# Patient Record
Sex: Male | Born: 1990 | Race: Black or African American | Hispanic: No | Marital: Single | Smoking: Current every day smoker
Health system: Southern US, Community
[De-identification: ages and names within clinical notes are randomized; demographics above are authoritative.]

## PROBLEM LIST (undated history)

## (undated) DIAGNOSIS — L309 Dermatitis, unspecified: Secondary | ICD-10-CM

---
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
## 2015-01-22 ENCOUNTER — Encounter (HOSPITAL_COMMUNITY): Payer: Self-pay | Admitting: *Deleted

## 2015-01-22 ENCOUNTER — Emergency Department (HOSPITAL_COMMUNITY)
Admission: EM | Admit: 2015-01-22 | Discharge: 2015-01-23 | Disposition: A | Discharge status: Home / Self Care | Payer: 59 | Attending: Emergency Medicine | Admitting: Emergency Medicine

## 2015-01-22 DIAGNOSIS — M7981 Nontraumatic hematoma of soft tissue: Secondary | ICD-10-CM | POA: Diagnosis not present

## 2015-01-22 DIAGNOSIS — Z720 Tobacco use: Secondary | ICD-10-CM | POA: Diagnosis not present

## 2015-01-22 DIAGNOSIS — Z872 Personal history of diseases of the skin and subcutaneous tissue: Secondary | ICD-10-CM | POA: Insufficient documentation

## 2015-01-22 DIAGNOSIS — S301XXA Contusion of abdominal wall, initial encounter: Secondary | ICD-10-CM

## 2015-01-22 DIAGNOSIS — R319 Hematuria, unspecified: Secondary | ICD-10-CM | POA: Diagnosis not present

## 2015-01-22 DIAGNOSIS — R109 Unspecified abdominal pain: Secondary | ICD-10-CM | POA: Diagnosis not present

## 2015-01-22 DIAGNOSIS — R52 Pain, unspecified: Secondary | ICD-10-CM

## 2015-01-22 HISTORY — DX: Dermatitis, unspecified: L30.9

## 2015-01-22 LAB — URINALYSIS, ROUTINE W REFLEX MICROSCOPIC
BILIRUBIN URINE: NEGATIVE
Glucose, UA: NEGATIVE mg/dL
Hgb urine dipstick: NEGATIVE
KETONES UR: NEGATIVE mg/dL
Leukocytes, UA: NEGATIVE
Nitrite: NEGATIVE
PH: 5.5 (ref 5.0–8.0)
PROTEIN: 30 mg/dL — AB
SPECIFIC GRAVITY, URINE: 1.028 (ref 1.005–1.030)
UROBILINOGEN UA: 0.2 mg/dL (ref 0.0–1.0)

## 2015-01-22 LAB — URINE MICROSCOPIC-ADD ON

## 2015-01-22 NOTE — ED Notes (Signed)
Pt reports hematuria which started yesterday, went to [facility].  Was found to have blood and protein in his urine.  Has an appt with [facility] on 8/29.  Pt reports today, he started feeling a "pinch" in his RUQ.

## 2015-01-23 ENCOUNTER — Emergency Department (HOSPITAL_COMMUNITY): Payer: 59

## 2015-01-23 LAB — CBC WITH DIFFERENTIAL/PLATELET
BASOS PCT: 0 % (ref 0–1)
Basophils Absolute: 0 10*3/uL (ref 0.0–0.1)
Eosinophils Absolute: 0.2 10*3/uL (ref 0.0–0.7)
Eosinophils Relative: 3 % (ref 0–5)
HCT: 43.3 % (ref 39.0–52.0)
HEMOGLOBIN: 15 g/dL (ref 13.0–17.0)
LYMPHS ABS: 2.2 10*3/uL (ref 0.7–4.0)
Lymphocytes Relative: 49 % — ABNORMAL HIGH (ref 12–46)
MCH: 28.1 pg (ref 26.0–34.0)
MCHC: 34.6 g/dL (ref 30.0–36.0)
MCV: 81.2 fL (ref 78.0–100.0)
MONO ABS: 0.3 10*3/uL (ref 0.1–1.0)
MONOS PCT: 8 % (ref 3–12)
Neutro Abs: 1.8 10*3/uL (ref 1.7–7.7)
Neutrophils Relative %: 40 % — ABNORMAL LOW (ref 43–77)
Platelets: 172 10*3/uL (ref 150–400)
RBC: 5.33 MIL/uL (ref 4.22–5.81)
RDW: 13.2 % (ref 11.5–15.5)
WBC: 4.5 10*3/uL (ref 4.0–10.5)

## 2015-01-23 LAB — I-STAT CHEM 8, ED
BUN: 16 mg/dL (ref 6–20)
CALCIUM ION: 1.16 mmol/L (ref 1.12–1.23)
CHLORIDE: 102 mmol/L (ref 101–111)
CREATININE: 1.2 mg/dL (ref 0.61–1.24)
GLUCOSE: 88 mg/dL (ref 65–99)
HCT: 47 % (ref 39.0–52.0)
Hemoglobin: 16 g/dL (ref 13.0–17.0)
Potassium: 3.8 mmol/L (ref 3.5–5.1)
SODIUM: 141 mmol/L (ref 135–145)
TCO2: 26 mmol/L (ref 0–100)

## 2015-01-23 NOTE — ED Provider Notes (Signed)
CSN: [id number]     Arrival date & time 01/22/15  2203 History   First MD Initiated Contact with Patient 01/23/15 0151     Chief Complaint  Patient presents with  . Hematuria     (Consider location/radiation/quality/duration/timing/severity/associated sxs/prior Treatment) HPI Comments: Patient states that for the past 2 days he's noticed some blood at the tip of his penis after urinating he went to an urgent care today who told him he had hemoglobin and protein in his urine other were concerned about his kidney function and he should be seen by his primary care physician but he felt he could wait. He denies any penile discharge today he is a virgin and has had no sexual contact he denies any pain in the shaft of his penis or testicular pain He also states that he's got some right upper quadrant pain feels like a pinch and when he touches the base of his ribs hurt reports that his 77-year-old nephew plays very rough and jump set him and has needed them in the abdomen almost on a daily basis he denies any nausea vomiting shortness of breath constipation or diarrhea  Patient is a 24 y.o. Todd [name] presenting with hematuria. The history is provided by the patient.  Hematuria This is a new problem. The current episode started yesterday. The problem occurs intermittently. The problem has been unchanged. Pertinent negatives include no abdominal pain, chills, fatigue, fever, nausea, sore throat, swollen glands, urinary symptoms or weakness. Nothing aggravates the symptoms. He has tried nothing for the symptoms. The treatment provided no relief.    Past Medical History  Diagnosis Date  . Eczema    History reviewed. No pertinent past surgical history. No family history on file. History  Substance Use Topics  . Smoking status: Current Every Day Smoker  . Smokeless tobacco: Not on file  . Alcohol Use: No    Review of Systems  Constitutional: Negative for fever, chills and fatigue.  HENT: Negative for  sore throat.   Gastrointestinal: Negative for nausea and abdominal pain.  Genitourinary: Positive for hematuria. Negative for dysuria, frequency, decreased urine volume, discharge, penile swelling, scrotal swelling, genital sores, penile pain and testicular pain.  Neurological: Negative for weakness.  All other systems reviewed and are negative.     Allergies  Review of patient's allergies indicates no known allergies.  Home Medications   Prior to Admission medications   Medication Sig Start Date End Date Taking? Authorizing Provider  ibuprofen (ADVIL,MOTRIN) 200 MG tablet Take 400 mg by mouth every 6 (six) hours as needed for moderate pain.   Yes Historical Provider, MD   BP 117/62 mmHg  Pulse 59  Temp(Src) 97.9 F (36.6 C) (Oral)  Resp 16  SpO2 99% Physical Exam  Constitutional: He is oriented to person, place, and time. He appears well-developed and well-nourished.  HENT:  Head: Normocephalic.  Eyes: Pupils are equal, round, and reactive to light.  Neck: Normal range of motion.  Cardiovascular: Normal rate and regular rhythm.   Pulmonary/Chest: Effort normal and breath sounds normal.  Abdominal: Soft. He exhibits no distension. There is no tenderness.  Genitourinary: Penis normal.  Musculoskeletal: Normal range of motion.  Neurological: He is alert and oriented to person, place, and time.  Skin: Skin is warm.  Nursing note and vitals reviewed.   ED Course  Procedures (including critical care time) Labs Review Labs Reviewed  URINALYSIS, ROUTINE W REFLEX MICROSCOPIC (NOT AT [facility]) - Abnormal; Notable for the following:    Color,  Urine AMBER (*)    APPearance CLOUDY (*)    Protein, ur 30 (*)    All other components within normal limits  CBC WITH DIFFERENTIAL/PLATELET - Abnormal; Notable for the following:    Neutrophils Relative % 40 (*)    Lymphocytes Relative 49 (*)    All other components within normal limits  URINE MICROSCOPIC-ADD ON  I-STAT CHEM 8, ED     Imaging Review Dg Chest 2 View  01/23/2015   CLINICAL DATA:  Subphrenic pain for Todd hr on the right  EXAM: CHEST  2 VIEW  COMPARISON:  07/01/2009  FINDINGS: The heart size and mediastinal contours are within normal limits. Both lungs are clear. The visualized skeletal structures are unremarkable.  IMPRESSION: No active cardiopulmonary disease.   Electronically Signed   By: Ellery Plunk M.D.   On: 01/23/2015 02:42     EKG Interpretation None      MDM   Final diagnoses:  Pain  Hematuria  Abdominal wall contusion, initial encounter         Earley Favor, NP 01/23/15 1610  Devoria Albe, MD 01/23/15 [phone]

## 2015-01-23 NOTE — ED Notes (Signed)
Pt reports 2 days ago, he noticed blood in his urine, went to [facility] and was told he had hgb and protein in his urine.  Blood draw done but still does not have results.  Came to the ED today because he had another episode of hematuria earlier.  Pt reports it's only in the am.  Pt also reports feeling a "pinch" in his RUQ today.  Pt in NAD.

## 2015-01-23 NOTE — Discharge Instructions (Signed)
No exact cause for your hematuria has been identified tonight it does not appear that you have a urinary tract infection or kidney function is normal which is at probably should drink more water he been given a referral to urology for follow-up please call and make an appointment to be seen as soon as possible please document any subsequent episodes of hematuria that you was aware of

## 2015-04-09 ENCOUNTER — Encounter (HOSPITAL_COMMUNITY): Payer: Self-pay | Admitting: Emergency Medicine

## 2015-04-09 ENCOUNTER — Emergency Department (HOSPITAL_COMMUNITY)
Admission: EM | Admit: 2015-04-09 | Discharge: 2015-04-09 | Disposition: A | Discharge status: Home / Self Care | Payer: 59 | Attending: Emergency Medicine | Admitting: Emergency Medicine

## 2015-04-09 DIAGNOSIS — Z720 Tobacco use: Secondary | ICD-10-CM | POA: Insufficient documentation

## 2015-04-09 DIAGNOSIS — H73011 Bullous myringitis, right ear: Secondary | ICD-10-CM | POA: Insufficient documentation

## 2015-04-09 DIAGNOSIS — H9201 Otalgia, right ear: Secondary | ICD-10-CM | POA: Diagnosis present

## 2015-04-09 DIAGNOSIS — Z872 Personal history of diseases of the skin and subcutaneous tissue: Secondary | ICD-10-CM | POA: Diagnosis not present

## 2015-04-09 MED ORDER — HYDROCODONE-ACETAMINOPHEN 5-325 MG PO TABS
1.0000 | ORAL_TABLET | Freq: Once | ORAL | Status: AC
  Administered 2015-04-09: 1 via ORAL
  Filled 2015-04-09: qty 1

## 2015-04-09 MED ORDER — AZITHROMYCIN 250 MG PO TABS: 250.0000 mg | ORAL_TABLET | Freq: Every day | ORAL | Status: DC

## 2015-04-09 NOTE — ED Notes (Signed)
Dr. James at bedside  

## 2015-04-09 NOTE — ED Notes (Signed)
C/o R ear pain since Wednesday.

## 2015-04-09 NOTE — Discharge Instructions (Signed)
Antibiotic as prescribed.  Motrin or Tylenol for pain   Sudafed one dose each morning

## 2015-04-09 NOTE — ED Provider Notes (Signed)
CSN: [id number]     Arrival date & time 04/09/15  0159 History  By signing my name below, I, Todd [name], attest that this documentation has been prepared under the direction and in the presence of Todd PorterMark Kelliann Pendergraph, MD. Electronically Signed: Doreatha MartinEva [name], ED Scribe. 04/09/2015. 2:42 AM.    Chief Complaint  Patient presents with  . Otalgia   The history is provided by the patient. No language interpreter was used.    HPI Comments: Todd KindsMicah [name] is a 24 y.o. male who presents to the Emergency Department complaining of moderate right-sided ear pain onset yesterday and worsened an hour ago. No known trauma or injury. NKDA. He denies drainage from the ear, epistaxis, left-sided ear pain.    Past Medical History  Diagnosis Date  . Eczema    History reviewed. No pertinent past surgical history. No family history on file. Social History  Substance Use Topics  . Smoking status: Current Every Day Smoker  . Smokeless tobacco: None  . Alcohol Use: No    Review of Systems  Constitutional: Negative for fever, chills, diaphoresis, appetite change and fatigue.  HENT: Positive for ear pain. Negative for ear discharge, mouth sores, nosebleeds, sore throat and trouble swallowing.   Eyes: Negative for visual disturbance.  Respiratory: Negative for cough, chest tightness, shortness of breath and wheezing.   Cardiovascular: Negative for chest pain.  Gastrointestinal: Negative for nausea, vomiting, abdominal pain, diarrhea and abdominal distention.  Endocrine: Negative for polydipsia, polyphagia and polyuria.  Genitourinary: Negative for dysuria, frequency and hematuria.  Musculoskeletal: Negative for gait problem.  Skin: Negative for color change, pallor and rash.  Neurological: Negative for dizziness, syncope, light-headedness and headaches.  Hematological: Does not bruise/bleed easily.  Psychiatric/Behavioral: Negative for behavioral problems and confusion.   Allergies  Review of patient's allergies  indicates no known allergies.  Home Medications   Prior to Admission medications   Medication Sig Start Date End Date Taking? Authorizing Provider  Phenyleph-Doxylamine-DM-APAP (ALKA-SELTZER PLS NIGHT CLD/FLU) 5-6.25-10-325 MG CAPS Take 1-2 capsules by mouth every 6 (six) hours as needed (cold or flu symptoms).   Yes Historical Provider, MD  azithromycin (ZITHROMAX Z-PAK) 250 MG tablet Take 1 tablet (250 mg total) by mouth daily. 04/09/15   Todd PorterMark Asaph Serena, MD   BP 128/79 mmHg  Pulse 78  Temp(Src) 98.4 F (36.9 C) (Oral)  Resp 16  SpO2 99% Physical Exam  Constitutional: He is oriented to person, place, and time. He appears well-developed and well-nourished. No distress.  HENT:  Head: Normocephalic.  Erythematous TMs bilaterally. ? Blood. Multiple bullae right TM.   Eyes: Conjunctivae are normal. Pupils are equal, round, and reactive to light. No scleral icterus.  Neck: Normal range of motion. Neck supple. No thyromegaly present.  Cardiovascular: Normal rate and regular rhythm.  Exam reveals no gallop and no friction rub.   No murmur heard. Pulmonary/Chest: Effort normal and breath sounds normal. No respiratory distress. He has no wheezes. He has no rales.  Abdominal: Soft. Bowel sounds are normal. He exhibits no distension. There is no tenderness. There is no rebound.  Musculoskeletal: Normal range of motion.  Neurological: He is alert and oriented to person, place, and time.  Skin: Skin is warm and dry. No rash noted.  Psychiatric: He has a normal mood and affect. His behavior is normal.  Nursing note and vitals reviewed.  ED Course  Procedures (including critical care time) DIAGNOSTIC STUDIES: Oxygen Saturation is 96% on RA, adequate by my interpretation.    COORDINATION OF  CARE: 2:42 AM Discussed treatment plan with pt at bedside and pt agreed to plan.   MDM   Final diagnoses:  Bullous myringitis, right   I personally performed the services described in this documentation,  which was scribed in my presence. The recorded information has been reviewed and is accurate.   Todd Porter, MD 04/10/15 [phone]

## 2015-04-09 NOTE — ED Notes (Signed)
Reviewed discharge instructions and prescription medication with patient and patient's mother.  Both verbalized understanding r/e antibiotic and follow-up information.

## 2016-07-04 IMAGING — CR DG CHEST 2V
2 series · 2 of 2 positions shown · non-contrast
Comparison: 07/01/2009

CLINICAL DATA: Subphrenic pain for 24 hr on the right

EXAM:
CHEST  2 VIEW

[w chest pa]
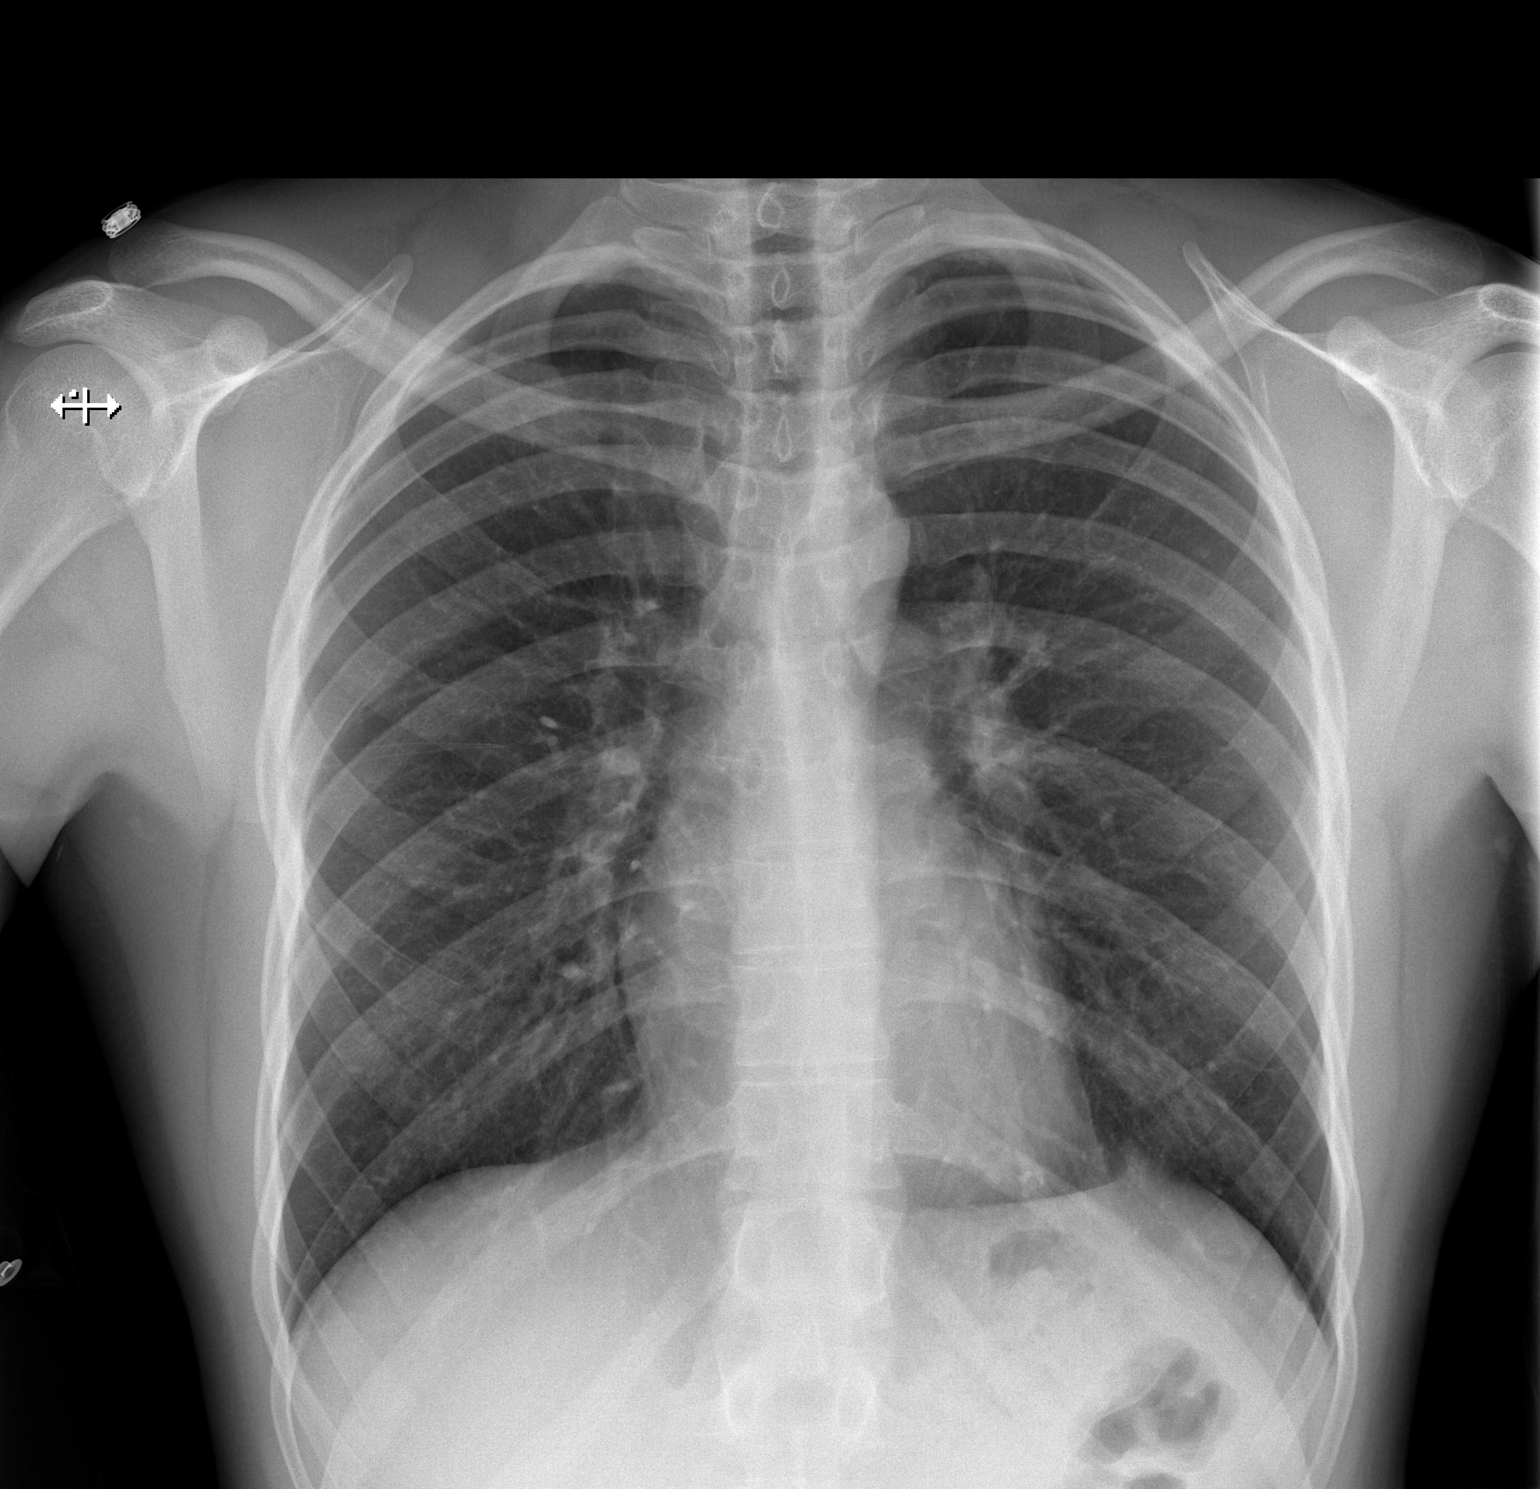

[w chest lat]
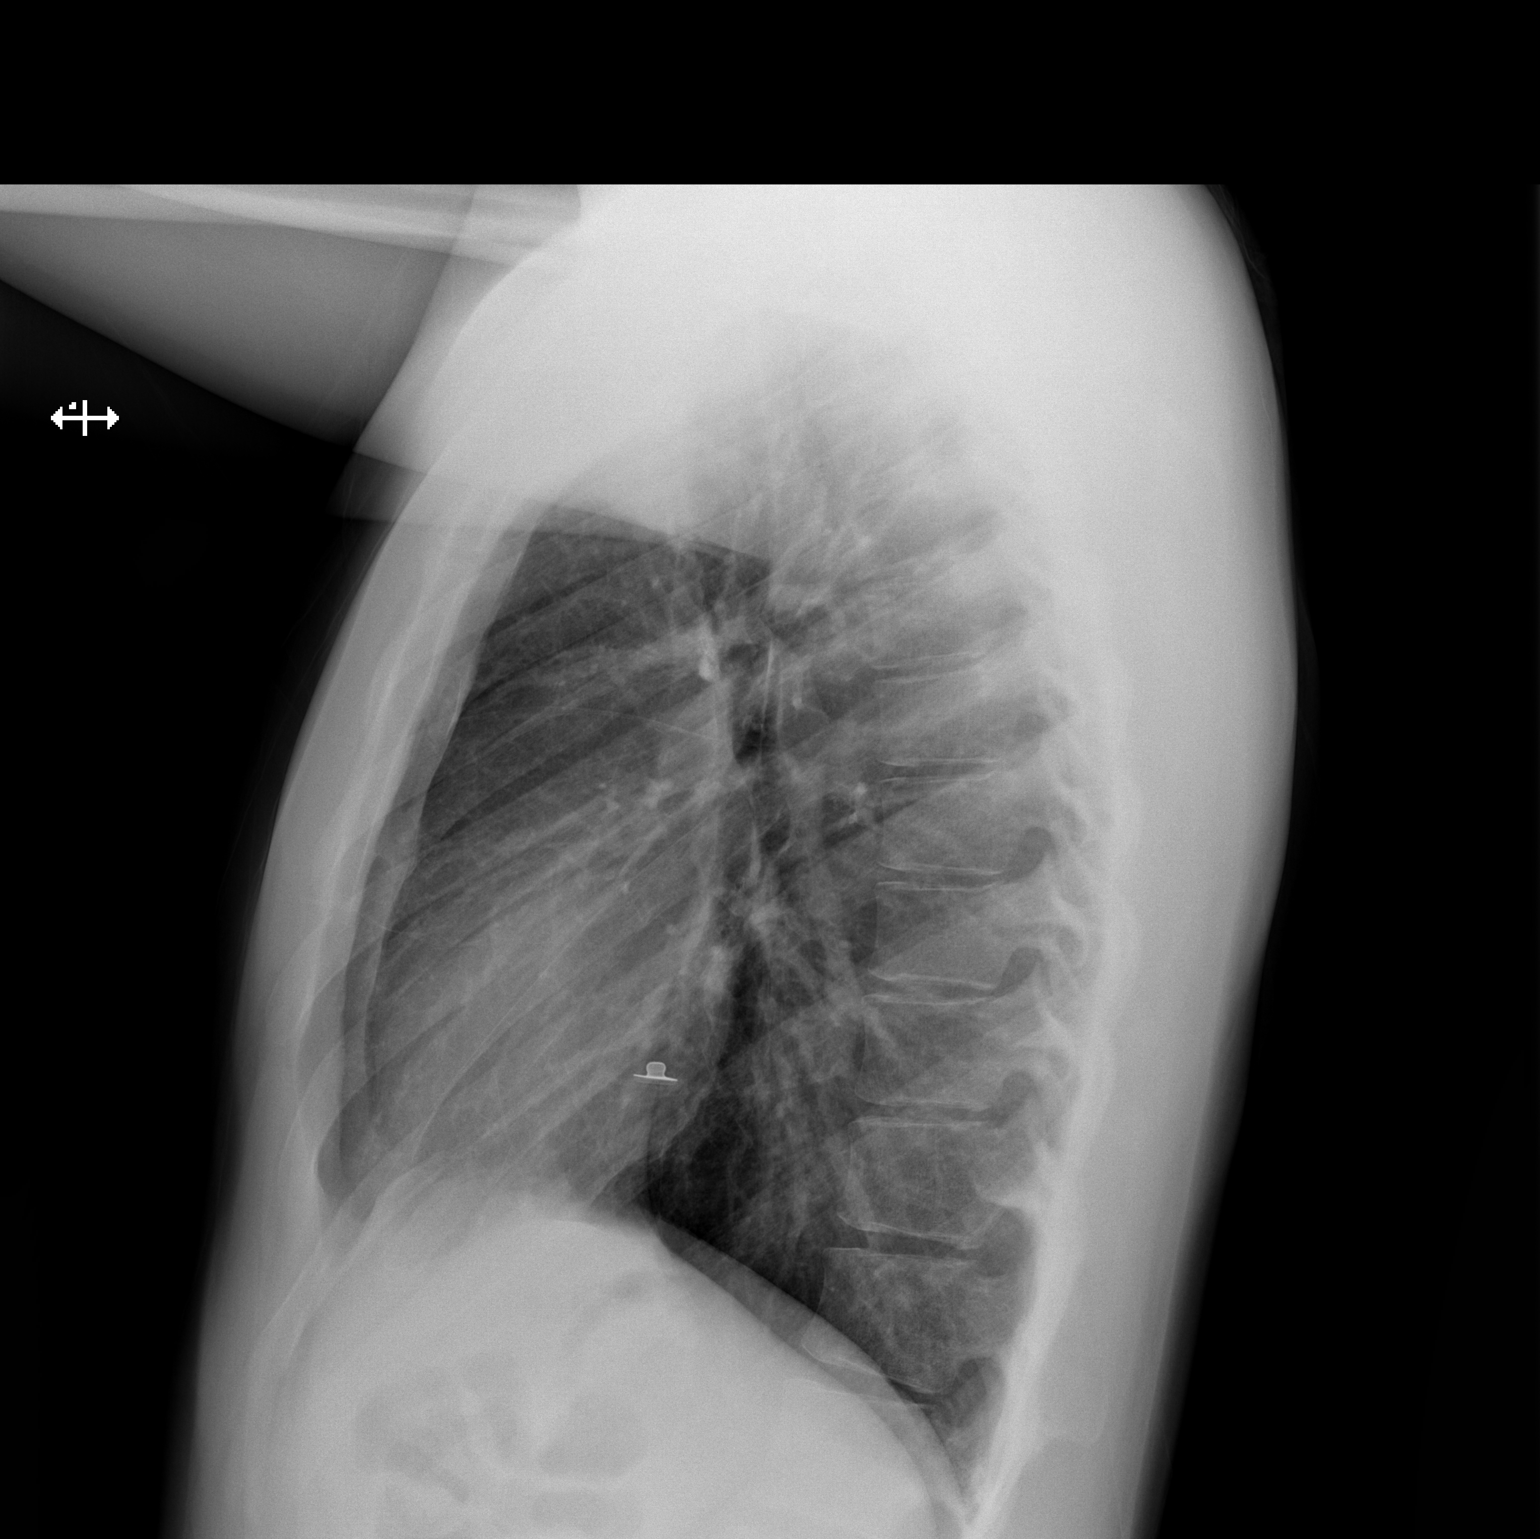

[2 of 2 positions shown; findings below may reference images not displayed]

FINDINGS: The heart size and mediastinal contours are within normal limits.
Both lungs are clear. The visualized skeletal structures are
unremarkable.
IMPRESSION: No active cardiopulmonary disease.

## 2016-07-26 ENCOUNTER — Ambulatory Visit (HOSPITAL_COMMUNITY)
Admission: EM | Admit: 2016-07-26 | Discharge: 2016-07-26 | Disposition: A | Discharge status: Home / Self Care | Payer: 59 | Attending: Family Medicine | Admitting: Family Medicine

## 2016-07-26 ENCOUNTER — Encounter (HOSPITAL_COMMUNITY): Payer: Self-pay | Admitting: Emergency Medicine

## 2016-07-26 DIAGNOSIS — K639 Disease of intestine, unspecified: Secondary | ICD-10-CM

## 2016-07-26 DIAGNOSIS — S161XXA Strain of muscle, fascia and tendon at neck level, initial encounter: Secondary | ICD-10-CM

## 2016-07-26 DIAGNOSIS — G44209 Tension-type headache, unspecified, not intractable: Secondary | ICD-10-CM | POA: Diagnosis not present

## 2016-07-26 MED ORDER — IBUPROFEN 800 MG PO TABS: 800.0000 mg | ORAL_TABLET | Freq: Once | ORAL | Status: DC

## 2016-07-26 NOTE — ED Triage Notes (Signed)
[facility] a [facility] headache, sore throat and pain when he breaths.

## 2016-07-26 NOTE — Discharge Instructions (Signed)
Apply heat to the neck and perform stretches as demonstrated. Headache is likely tension-type headache. Hopefully the ibuprofen will help with that. The occasional discomfort felt in the left side of the lower chest and upper abdomen is likely gas high up in the colon. This should go away in the next few hours or day. If not follow-up your primary care doctor.

## 2016-07-26 NOTE — ED Provider Notes (Signed)
CSN: [id number]     Arrival date & time 07/26/16  1827 History   First MD Initiated Contact with Patient 07/26/16 2014     Chief Complaint  Patient presents with  . Headache   (Consider location/radiation/quality/duration/timing/severity/associated sxs/prior Treatment) 26 year old male considers himself generally good health is coming by his mother complaining of pain along the left lateral neck tickly when taking deep breath. He also has pain to the left lower anterolateral ribs when taking a deep breath. He is complaining of a headache in the vertex and occipital area. Describes it as relatively mild and annoying. Denies fevers, earache, upper respiratory symptoms. Denies trauma. Denies any known injury or fall.      Past Medical History:  Diagnosis Date  . Eczema    History reviewed. No pertinent surgical history. History reviewed. No pertinent family history. Social History  Substance Use Topics  . Smoking status: Current Every Day Smoker  . Smokeless tobacco: Not on file  . Alcohol use No    Review of Systems  Constitutional: Negative.   HENT: Negative.   Respiratory: Negative.   Gastrointestinal: Negative.   Genitourinary: Negative.   Musculoskeletal: Positive for myalgias.  Skin: Negative.   Neurological: Positive for headaches. Negative for dizziness, tremors, syncope, facial asymmetry and speech difficulty.  All other systems reviewed and are negative.   Allergies  Patient has no known allergies.  Home Medications   Prior to Admission medications   Medication Sig Start Date End Date Taking? Authorizing Provider  azithromycin (ZITHROMAX Z-PAK) 250 MG tablet Take 1 tablet (250 mg total) by mouth daily. 04/09/15   Rolland Porter, MD   Meds Ordered and Administered this Visit   Medications  ibuprofen (ADVIL,MOTRIN) tablet 800 mg (not administered)    BP 124/72 (BP Location: Left Arm)   Pulse 68   Temp 98.8 F (37.1 C) (Oral)   Resp 16   SpO2 100%  No data  found.   Physical Exam  Constitutional: He is oriented to person, place, and time. He appears well-developed and well-nourished. No distress.  HENT:  Head: Normocephalic and atraumatic.  Mouth/Throat: No oropharyngeal exudate.  Bilateral TMs are normal. Oropharynx is clear and moist. Supple rises symmetrically. Tongue and uvula midline. Swallowing reflex is normal.  Eyes: EOM are normal. Pupils are equal, round, and reactive to light.  Neck: Normal range of motion. Neck supple.  Tenderness is easily tracked along the full length of the left sternocleidomastoid muscle. Having the patient rotate head to the right and palpate muscle reproduces the same pain. Demonstrates full range of motion of the neck.  Cardiovascular: Normal rate, regular rhythm, normal heart sounds and intact distal pulses.   Pulmonary/Chest: Effort normal and breath sounds normal. No respiratory distress. He has no wheezes. He has no rales.  Abdominal: Soft. He exhibits no distension and no mass. There is no tenderness. There is no guarding.  Musculoskeletal: Normal range of motion.  Lymphadenopathy:    He has no cervical adenopathy.  Neurological: He is alert and oriented to person, place, and time. He has normal strength. No cranial nerve deficit or sensory deficit. GCS eye subscore is 4. GCS verbal subscore is 5. GCS motor subscore is 6.  Skin: Skin is warm and dry.  Nursing note and vitals reviewed.   Urgent Care Course     Procedures (including critical care time)  Labs Review Labs Reviewed - No data to display  Imaging Review No results found.   Visual Acuity Review  Right  Eye Distance:   Left Eye Distance:   Bilateral Distance:    Right Eye Near:   Left Eye Near:    Bilateral Near:         MDM   1. Acute non intractable tension-type headache   2. Neck muscle strain, initial encounter   3. Splenic flexure syndrome    Apply heat to the neck and perform stretches as demonstrated. Headache  is likely tension-type headache. Hopefully the ibuprofen will help with that. The occasional discomfort felt in the left side of the lower chest and upper abdomen is likely gas high up in the colon. This should go away in the next few hours or day. If not follow-up your primary care doctor. Meds ordered this encounter  Medications  . ibuprofen (ADVIL,MOTRIN) tablet 800 mg       Hayden Rasmussenavid Avin Gibbons, NP 07/26/16 2046

## 2017-09-06 ENCOUNTER — Emergency Department (HOSPITAL_COMMUNITY)
Admission: EM | Admit: 2017-09-06 | Discharge: 2017-09-06 | Disposition: A | Discharge status: Other Acute Inpatient Hospital | Payer: 59 | Attending: Emergency Medicine | Admitting: Emergency Medicine

## 2017-09-06 ENCOUNTER — Encounter (HOSPITAL_COMMUNITY): Payer: Self-pay | Admitting: Emergency Medicine

## 2017-09-06 ENCOUNTER — Other Ambulatory Visit: Payer: Self-pay

## 2017-09-06 ENCOUNTER — Encounter (HOSPITAL_COMMUNITY): Payer: Self-pay

## 2017-09-06 ENCOUNTER — Inpatient Hospital Stay (HOSPITAL_COMMUNITY)
Admission: AD | Admit: 2017-09-06 | Discharge: 2017-09-09 | DRG: 885 | Disposition: A | Discharge status: Home / Self Care | Payer: 59 | Source: Intra-hospital | Attending: Psychiatry | Admitting: Psychiatry

## 2017-09-06 DIAGNOSIS — R45851 Suicidal ideations: Secondary | ICD-10-CM | POA: Diagnosis present

## 2017-09-06 DIAGNOSIS — E876 Hypokalemia: Secondary | ICD-10-CM | POA: Diagnosis present

## 2017-09-06 DIAGNOSIS — F17200 Nicotine dependence, unspecified, uncomplicated: Secondary | ICD-10-CM | POA: Diagnosis present

## 2017-09-06 DIAGNOSIS — F4320 Adjustment disorder, unspecified: Secondary | ICD-10-CM

## 2017-09-06 DIAGNOSIS — Z630 Problems in relationship with spouse or partner: Secondary | ICD-10-CM | POA: Diagnosis not present

## 2017-09-06 DIAGNOSIS — F322 Major depressive disorder, single episode, severe without psychotic features: Principal | ICD-10-CM | POA: Diagnosis present

## 2017-09-06 DIAGNOSIS — F321 Major depressive disorder, single episode, moderate: Secondary | ICD-10-CM | POA: Insufficient documentation

## 2017-09-06 DIAGNOSIS — Y900 Blood alcohol level of less than 20 mg/100 ml: Secondary | ICD-10-CM | POA: Diagnosis not present

## 2017-09-06 DIAGNOSIS — F17210 Nicotine dependence, cigarettes, uncomplicated: Secondary | ICD-10-CM | POA: Diagnosis not present

## 2017-09-06 DIAGNOSIS — Z818 Family history of other mental and behavioral disorders: Secondary | ICD-10-CM

## 2017-09-06 DIAGNOSIS — F99 Mental disorder, not otherwise specified: Secondary | ICD-10-CM | POA: Diagnosis present

## 2017-09-06 DIAGNOSIS — F1099 Alcohol use, unspecified with unspecified alcohol-induced disorder: Secondary | ICD-10-CM | POA: Diagnosis not present

## 2017-09-06 LAB — CBC
HEMATOCRIT: 44.9 % (ref 39.0–52.0)
HEMOGLOBIN: 15.5 g/dL (ref 13.0–17.0)
MCH: 28.4 pg (ref 26.0–34.0)
MCHC: 34.5 g/dL (ref 30.0–36.0)
MCV: 82.4 fL (ref 78.0–100.0)
Platelets: 205 10*3/uL (ref 150–400)
RBC: 5.45 MIL/uL (ref 4.22–5.81)
RDW: 13.1 % (ref 11.5–15.5)
WBC: 6 10*3/uL (ref 4.0–10.5)

## 2017-09-06 LAB — COMPREHENSIVE METABOLIC PANEL
ALBUMIN: 4.4 g/dL (ref 3.5–5.0)
ALK PHOS: 61 U/L (ref 38–126)
ALT: 20 U/L (ref 17–63)
AST: 21 U/L (ref 15–41)
Anion gap: 12 (ref 5–15)
BUN: 12 mg/dL (ref 6–20)
CHLORIDE: 101 mmol/L (ref 101–111)
CO2: 24 mmol/L (ref 22–32)
CREATININE: 1.26 mg/dL — AB (ref 0.61–1.24)
Calcium: 9.3 mg/dL (ref 8.9–10.3)
GFR calc Af Amer: >60 mL/min (ref >60)
GFR calc non Af Amer: >60 mL/min (ref >60)
GLUCOSE: 87 mg/dL (ref 65–99)
Potassium: 3.3 mmol/L — ABNORMAL LOW (ref 3.5–5.1)
SODIUM: 137 mmol/L (ref 135–145)
Total Bilirubin: 2.8 mg/dL — ABNORMAL HIGH (ref 0.3–1.2)
Total Protein: 7.6 g/dL (ref 6.5–8.1)

## 2017-09-06 LAB — RAPID URINE DRUG SCREEN, HOSP PERFORMED
AMPHETAMINES: NOT DETECTED
BARBITURATES: NOT DETECTED
Benzodiazepines: NOT DETECTED
Cocaine: NOT DETECTED
OPIATES: NOT DETECTED
TETRAHYDROCANNABINOL: NOT DETECTED

## 2017-09-06 LAB — ACETAMINOPHEN LEVEL: Acetaminophen (Tylenol), Serum: <10 ug/mL — ABNORMAL LOW (ref 10–30)

## 2017-09-06 LAB — ETHANOL: Alcohol, Ethyl (B): <10 mg/dL (ref <10)

## 2017-09-06 LAB — SALICYLATE LEVEL

## 2017-09-06 MED ORDER — ACETAMINOPHEN 325 MG PO TABS: 650.0000 mg | ORAL_TABLET | ORAL | Status: DC | PRN

## 2017-09-06 MED ORDER — MAGNESIUM HYDROXIDE 400 MG/5ML PO SUSP: 30.0000 mL | Freq: Every day | ORAL | Status: DC | PRN

## 2017-09-06 MED ORDER — ONDANSETRON HCL 4 MG PO TABS: 4.0000 mg | ORAL_TABLET | Freq: Three times a day (TID) | ORAL | Status: DC | PRN

## 2017-09-06 MED ORDER — ALUM & MAG HYDROXIDE-SIMETH 200-200-20 MG/5ML PO SUSP: 30.0000 mL | ORAL | Status: DC | PRN

## 2017-09-06 NOTE — ED Provider Notes (Signed)
[facility] EMERGENCY DEPARTMENT Provider Note   CSN: [id number] Arrival date & time: 09/06/17  0025     History   Chief Complaint Chief Complaint  Patient presents with  . Suicidal    HPI Todd [name] is a 27 y.o. male.  The history is provided by the patient.  Mental Health Problem  Presenting symptoms: suicidal thoughts   Patient accompanied by:  Parent Degree of incapacity (severity):  Moderate Onset quality:  Gradual Timing:  Constant Progression:  Worsening Chronicity:  New Context: stressful life event   Relieved by:  Nothing Worsened by:  Family interactions Associated symptoms: no abdominal pain and no headaches   Patient with history of eczema presents with suicidal thoughts.  He reports he been feeling very lonely, he feels abandoned by his friends.  He is been having thoughts of slitting his wrist.  He denies any alcohol or drug abuse.  He has never attempted suicide before  Past Medical History:  Diagnosis Date  . Eczema     There are no active problems to display for this patient.   History reviewed. No pertinent surgical history.     Home Medications    Prior to Admission medications   Medication Sig Start Date End Date Taking? Authorizing Provider  azithromycin (ZITHROMAX Z-PAK) 250 MG tablet Take 1 tablet (250 mg total) by mouth daily. Patient not taking: Reported on 09/06/2017 04/09/15   Rolland Porter, MD    Family History No family history on file.  Social History Social History   Tobacco Use  . Smoking status: Current Every Day Smoker  Substance Use Topics  . Alcohol use: No  . Drug use: No     Allergies   Patient has no known allergies.   Review of Systems Review of Systems  Constitutional: Negative for fever.  Gastrointestinal: Negative for abdominal pain.  Neurological: Negative for headaches.  Psychiatric/Behavioral: Positive for suicidal ideas.  All other systems reviewed and are  negative.    Physical Exam Updated Vital Signs BP 110/61 (BP Location: Left Arm)   Pulse 60   Temp 98.4 F (36.9 C) (Oral)   Resp 18   SpO2 100%   Physical Exam CONSTITUTIONAL: Well developed/well nourished HEAD: Normocephalic/atraumatic EYES: EOMI ENMT: Mucous membranes moist NECK: supple no meningeal signs CV: S1/S2 noted, no murmurs/rubs/gallops noted LUNGS: Lungs are clear to auscultation bilaterally, no apparent distress ABDOMEN: soft, nontender NEURO: Pt is awake/alert/appropriate, moves all extremitiesx4.  No facial droop.   EXTREMITIES: pulses normal/equal, full ROM SKIN: warm, color normal PSYCH: no abnormalities of mood noted, alert and oriented to situation   ED Treatments / Results  Labs (all labs ordered are listed, but only abnormal results are displayed) Labs Reviewed  COMPREHENSIVE METABOLIC PANEL - Abnormal; Notable for the following components:      Result Value   Potassium 3.3 (*)    Creatinine, Ser 1.26 (*)    Total Bilirubin 2.8 (*)    All other components within normal limits  ACETAMINOPHEN LEVEL - Abnormal; Notable for the following components:   Acetaminophen (Tylenol), Serum <10 (*)    All other components within normal limits  ETHANOL  SALICYLATE LEVEL  CBC  RAPID URINE DRUG SCREEN, HOSP PERFORMED    EKG  EKG Interpretation None       Radiology No results found.  Procedures Procedures  Medications Ordered in ED Medications  acetaminophen (TYLENOL) tablet 650 mg (not administered)  ondansetron (ZOFRAN) tablet 4 mg (not administered)  Initial Impression / Assessment and Plan / ED Course  I have reviewed the triage vital signs and the nursing notes.  Pertinent labs results that were available during my care of the patient were reviewed by me and considered in my medical decision making (see chart for details).     Patient medically stable.  We will consult behavioral health  Final Clinical Impressions(s) / ED  Diagnoses   Final diagnoses:  Suicidal thoughts    ED Discharge Orders    None       Zadie RhineWickline, Lovetta Condie, MD 09/06/17 [phone]

## 2017-09-06 NOTE — ED Provider Notes (Signed)
[facility]; Bed #407[facility]2. Donell SievertSpencer Simon, PA is the accepting provider.  Alvy BealShavon Rankin, NP is the attending provider.     Pt remains stable for transfer.     Jacalyn LefevreHaviland, Papa Piercefield, MD 09/06/17 1315

## 2017-09-06 NOTE — Tx Team (Signed)
Initial Treatment Plan 09/06/2017 4:31 PM Todd KindsMicah Hollinger [id number]    PATIENT STRESSORS: Financial difficulties Substance abuse Other: Feelings of loneliness and isolation   PATIENT STRENGTHS: Ability for insight Average or above average intelligence Motivation for treatment/growth Supportive family/friends   PATIENT IDENTIFIED PROBLEMS: Depression  Feelings of loneliness and isolation                   DISCHARGE CRITERIA:  Improved stabilization in mood, thinking, and/or behavior Motivation to continue treatment in a less acute level of care  PRELIMINARY DISCHARGE PLAN: Outpatient therapy Return to previous living arrangement  PATIENT/FAMILY INVOLVEMENT: This treatment plan has been presented to and reviewed with the patient, Todd [name].  The patient has been given the opportunity to ask questions and make suggestions.  Almira BarPenny G Charlottie Peragine, RN 09/06/2017, 4:31 PM

## 2017-09-06 NOTE — ED Notes (Signed)
TTS being done at this time.  

## 2017-09-06 NOTE — ED Notes (Signed)
Regular breakfast tray ordered.  

## 2017-09-06 NOTE — BH Assessment (Addendum)
Tele Assessment Note   Patient Name: Todd [name] MRN: [id number] Referring Physician: Zadie Rhineonald Wickline, MD Location of Patient: [facility] Location of Provider: Behavioral Health TTS Department  Todd [name] is an 27 y.o. male who [facility] accompanied by his mother reporting symptoms of depression and suicidal ideation. Pt denies previous MH history.  Pt denies current suicidal ideation, however he reports feeling suicidal yesterday with a plan to cut is wrists. Pt denies past attempts. Pt acknowledges symptoms including: sadness, fatigue, guilt, low self esteem, tearfulness, isolating, anger, irritability, negative outlook, helplessness, hopelessness, sleeping less, eating less and one  panic attack.  Pt denies homicidal ideation/ history of violence. Pt denies auditory or visual hallucinations or other psychotic symptoms. Pt states current stressors include feeling lonely over the past few months and his best friend being upset with him.   Pt lives with his parent and supports include his family, his best friend and his best friend's family. Pt denies history of abuse and trauma. Pt reports there is a family history of MH/SA. 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 Pt has fair insight and partial judgment. Pt's memory is intact.  Pt denies any legal history..  Pt denies OP/IP history.  Pt denies alcohol/ substance abuse.  Pt is dressed in scrubs, alert, oriented x4 with normal speech and normal motor behavior. Eye contact is good. Pt's mood is depressed and sad and affect is depressed and sad. Affect is congruent with mood. Thought process is coherent and relevant. There is no indication pt is currently responding to internal stimuli or experiencing delusional thought content. Pt was cooperative throughout assessment. Pt is currently able to contract for safety outside the hospital.   Diagnosis: F32.1 Major depressive disorder, Single episode, Moderate  Past Medical History:  Past  Medical History:  Diagnosis Date  . Eczema     History reviewed. No pertinent surgical history.  Family History: No family history on file.  Social History:  reports that he has been smoking.  He does not have any smokeless tobacco history on file. He reports that he does not drink alcohol or use drugs.  Additional Social History:  Alcohol / Drug Use Pain Medications: See MAR Prescriptions: See MAR Over the Counter: See MAR History of alcohol / drug use?: No history of alcohol / drug abuse  CIWA: CIWA-Ar BP: 110/61 Pulse Rate: 60 COWS:    Allergies: No Known Allergies  Home Medications:  (Not in a hospital admission)  OB/GYN Status:  No LMP for male patient.  General Assessment Data [facility] Assessment: In system Is this a Tele or Face-to-Face Assessment?: Tele Assessment Is this an Initial Assessment or a Re-assessment for this encounter?: Initial Assessment Marital status: Single Maiden name: NA Is patient pregnant?: No Pregnancy Status: No Living Arrangements: Parent Can pt return to current living arrangement?: Yes Admission Status: Voluntary Is patient capable of signing voluntary admission?: Yes Referral Source: Self/Family/Friend Insurance type: Engineer, [employer]     Crisis Care Plan Living Arrangements: Parent Name of Psychiatrist: NA Name of Therapist: NA  Education Status Is patient currently in school?: No Is the patient employed, unemployed or receiving disability?: Employed(Pt states self employed)  Risk to self with the past 6 months Suicidal Ideation: Yes-Currently Present Has patient been a risk to self within the past 6 months prior to admission? : Yes Suicidal Intent: No Has patient had any suicidal intent within the past 6 months prior to admission? : No Is patient  at risk for suicide?: No Suicidal Plan?: Yes-Currently Present Has patient had any suicidal plan within the past 6 months prior to admission? :  Yes Specify Current Suicidal Plan: Pt states to cut his wrists and other random thoughts Access to Means: Yes Specify Access to Suicidal Means: Pt has access to objects that can be used to cut What has been your use of drugs/alcohol within the last 12 months?: Pt denies Previous Attempts/Gestures: No Other Self Harm Risks: Pt denies Triggers for Past Attempts: None known Intentional Self Injurious Behavior: None Family Suicide History: No Recent stressful life event(s): Turmoil (Comment)(Pt states being lonely and not really having friends) Persecutory voices/beliefs?: No Depression: Yes Depression Symptoms: Despondent, Insomnia, Tearfulness, Isolating, Fatigue, Guilt, Feeling worthless/self pity, Feeling angry/irritable Substance abuse history and/or treatment for substance abuse?: No Suicide prevention information given to non-admitted patients: Not applicable  Risk to Others within the past 6 months Homicidal Ideation: No Does patient have any lifetime risk of violence toward others beyond the six months prior to admission? : No Thoughts of Harm to Others: No Current Homicidal Intent: No Current Homicidal Plan: No Access to Homicidal Means: No Identified Victim: Pt denies History of harm to others?: No Assessment of Violence: None Noted Violent Behavior Description: Pt denies Does patient have access to weapons?: No Criminal Charges Pending?: No Does patient have a court date: No Is patient on probation?: No  Psychosis Hallucinations: None noted Delusions: None noted  Mental Status Report Appearance/Hygiene: In scrubs Eye Contact: Good Motor Activity: Freedom of movement, Restlessness Speech: Logical/coherent Level of Consciousness: Alert, Quiet/awake Mood: Depressed, Sad Affect: Depressed, Sad, Appropriate to circumstance Anxiety Level: Panic Attacks Panic attack frequency: Once Most recent panic attack: Saturday Thought Processes: Coherent, Relevant Judgement:  Partial Orientation: Person, Place, Time, Situation, Appropriate for developmental age Obsessive Compulsive Thoughts/Behaviors: None  Cognitive Functioning Concentration: Normal Memory: Recent Intact, Remote Intact Is patient IDD: No Is patient DD?: No Insight: Fair Impulse Control: Fair Appetite: Poor Have you had any weight changes? : Loss Amount of the weight change? (lbs): 10 lbs Sleep: Decreased Total Hours of Sleep: 3 Vegetative Symptoms: Staying in bed  ADLScreening [facility] Assessment Services) Patient's cognitive ability adequate to safely complete daily activities?: Yes Patient able to express need for assistance with ADLs?: Yes Independently performs ADLs?: Yes (appropriate for developmental age)  Prior Inpatient Therapy Prior Inpatient Therapy: No  Prior Outpatient Therapy Prior Outpatient Therapy: No Does patient have an ACCT team?: No Does patient have Intensive In-House Services?  : No Does patient have [facility] services? : No Does patient have P4CC services?: No  ADL Screening (condition at time of admission) Patient's cognitive ability adequate to safely complete daily activities?: Yes Is the patient deaf or have difficulty hearing?: No Does the patient have difficulty seeing, even when wearing glasses/contacts?: No Does the patient have difficulty concentrating, remembering, or making decisions?: No Patient able to express need for assistance with ADLs?: Yes Does the patient have difficulty dressing or bathing?: No Independently performs ADLs?: Yes (appropriate for developmental age) Does the patient have difficulty walking or climbing stairs?: No Weakness of Legs: None Weakness of Arms/Hands: None  Home Assistive Devices/Equipment Home Assistive Devices/Equipment: None    Abuse/Neglect Assessment (Assessment to be complete while patient is alone) Abuse/Neglect Assessment Can Be Completed: Yes Physical Abuse: Denies Verbal Abuse: Denies Sexual Abuse:  Denies Exploitation of patient/patient's resources: Denies Self-Neglect: Denies     Merchant navy officer (For Healthcare) Does Patient Have a Medical Advance Directive?: No Would  patient like information on creating a medical advance directive?: No - Patient declined          Disposition: Gave clinical report to Donell Sievert, PA who recommends reassessment by psychiatry in the morning.  Notified Woody, RN of recommendation and she will notify Dr. Bebe Shaggy. Disposition Initial Assessment Completed for this Encounter: Yes Patient referred to: Other (Comment)(Reassessment by psychiatry in the morning)  This service was provided via telemedicine using a 2-way, interactive audio and video technology.  Names of all persons participating in this telemedicine service and their role in this encounter. Name: Todd [name] Role: Patient  Name: Annamaria Boots, MS, [facility] Role: TTS Counselor  Name:  Role:   Name:  Role:    Annamaria Boots, MS, [facility] Therapeutic Triage Specialist  Annamaria Boots 09/06/2017 5:38 AM

## 2017-09-06 NOTE — Progress Notes (Signed)
  DATA ACTION RESPONSE  Objective- Pt. is visible in the dayroom, seen interacting with peers.  Presents with an animated    affect and mood. Pt states he feels lonely but was not suicidal. No new c/o's.Pleasant on the unit.  Subjective- Denies having any SI/HI/AVH/Pain at this time.Is cooperative and remains safe on the unit.  1:1 interaction in private to establish rapport. Encouragement, education, & support given from staff.     Safety maintained with Q 15 checks. Continue with POC.

## 2017-09-06 NOTE — Progress Notes (Signed)
Patient is a 27 yo male, admitted voluntarily via [facility], accompanied by his mom, who is very supportive. Patient reports increasing symptoms of depression, including, loss of concentration, poor sleep, loss of appetite and intermittent passive suicidal thoughts. He has has some fleeting thoughts of cutting his wrists and driving his car into a tree, "Mostly because I know other people who have tried to kill theme selves that way. But I wonder how you can actually do it. I don't think I actually could." He also reports feeling of isolation and loneliness.   Patient reports he has very good supports in his family and his best friend and her family. 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   Skin assessment is negative.  No history of drug, alcohol or tobacco use.  A & O x 4. Denies SI, HI, AVH at this time.  Paperwork completed. Personal belongings secured in locker. Oriented to unit. Q 15 minute checks initiated

## 2017-09-06 NOTE — Progress Notes (Signed)
Adult Psychoeducational Group Note  Date:  09/06/2017 Time:  7:27 PM  Group Topic/Focus:  Building Self Esteem:   The Focus of this group is helping patients become aware of the effects of self-esteem on their lives, the things they and others do that enhance or undermine their self-esteem, seeing the relationship between their level of self-esteem and the choices they make and learning ways to enhance self-esteem.  Participation Level:  Active  Participation Quality:  Attentive  Affect:  Appropriate  Cognitive:  Appropriate  Insight: Appropriate, Good and Improving  Engagement in Group:  Engaged  Modes of Intervention:  Activity and Discussion  Additional Comments:  Pt attended group and participated in activity and discussion.  Riki Gehring R Graelyn Bihl 09/06/2017, 7:27 PM

## 2017-09-06 NOTE — Progress Notes (Signed)
Adult Psychoeducational Group Note  Date:  09/06/2017 Time:  10:16 PM  Group Topic/Focus:  Wrap-Up Group:   The focus of this group is to help patients review their daily goal of treatment and discuss progress on daily workbooks.  Participation Level:  Active  Participation Quality:  Appropriate  Affect:  Appropriate  Cognitive:  Appropriate  Insight: Appropriate  Engagement in Group:  Engaged  Modes of Intervention:  Discussion  Additional Comments:  Patient attended wrap up group and participated.  Drisana Schweickert W Lyanne Kates 09/06/2017, 10:16 PM

## 2017-09-06 NOTE — ED Triage Notes (Addendum)
Pt reports he has been having suicidal thoughts, "I have thought about slitting my wrist or running into a tree."   Pt is calm and cooperative in triage, very soft spoken and appears very sad.  Pt's mother took belongings, pt changed into paper scrubbs.

## 2017-09-06 NOTE — Progress Notes (Signed)
[facility]. Donell SievertSpencer Simon, PA is the accepting provider.  Alvy BealShavon Rankin, NP is the attending provider.   Call report to [phone]. [email or web address] notified.    Pt is Voluntary.   Pt may be transported by [facility].  [facility].   Wells GuilesSarah Grason Brailsford, LCSW, LCAS Disposition CSW [facility]/TTS [phone]

## 2017-09-07 ENCOUNTER — Encounter (HOSPITAL_COMMUNITY): Payer: Self-pay | Admitting: Psychiatry

## 2017-09-07 DIAGNOSIS — F4320 Adjustment disorder, unspecified: Secondary | ICD-10-CM

## 2017-09-07 DIAGNOSIS — Y900 Blood alcohol level of less than 20 mg/100 ml: Secondary | ICD-10-CM

## 2017-09-07 DIAGNOSIS — Z630 Problems in relationship with spouse or partner: Secondary | ICD-10-CM

## 2017-09-07 DIAGNOSIS — F1099 Alcohol use, unspecified with unspecified alcohol-induced disorder: Secondary | ICD-10-CM

## 2017-09-07 DIAGNOSIS — F322 Major depressive disorder, single episode, severe without psychotic features: Principal | ICD-10-CM

## 2017-09-07 NOTE — Progress Notes (Signed)
Adult Psychoeducational Group Note  Date:  09/07/2017 Time:  1:26 PM  Group Topic/Focus:  Making Healthy Choices:   The focus of this group is to help patients identify negative/unhealthy choices they were using prior to admission and identify positive/healthier coping strategies to replace them upon discharge.  Participation Level:  Active  Participation Quality:  Appropriate  Affect:  Appropriate  Cognitive:  Alert and Appropriate  Insight: Appropriate, Good and Improving  Engagement in Group:  Engaged  Modes of Intervention:  Activity and Discussion  Additional Comments:  Pt attended group and participated in activity and discussion.  Heidi Maclin R Lindley Stachnik 09/07/2017, 1:26 PM

## 2017-09-07 NOTE — BHH Suicide Risk Assessment (Signed)
[facility]   Nursing information obtained from:    Demographic factors:    Current Mental Status:    Loss Factors:    Historical Factors:    Risk Reduction Factors:     Total Time spent with patient: 45 minutes Principal Problem: MDD (major depressive disorder), severe (HCC) Diagnosis:   Patient Active Problem List   Diagnosis Date Noted  . MDD (major depressive disorder), severe (HCC) [F32.2] 09/06/2017   Subjective Data:  27 y.o AAM, single, self employed, lives with his family. No past history of mental illness. Presented to the ER in company of his mother. Reports feeling depressed. Reported to have expresed thoughts of slitting his wrist. Main stressor is recent end of a long term relationship. Routine labs essentially normal except for mild hypokalemia. Toxicology is negative. UDS is negative. BAL<10 mg/dl. No past suicidal behavior, no family history of suicide, no evidence of psychosis. No evidence of mania. No cognitive impairment. No access to weapons. He is cooperative with care. He has agreed to treatment recommendations. He has agreed to communicate suicidal thoughts to staff if the thoughts becomes overwhelming.    Continued Clinical Symptoms:  Alcohol Use Disorder Identification Test Final Score (AUDIT): 0 The "Alcohol Use Disorders Identification Test", Guidelines for Use in Primary Care, Second Edition.  World Science writerHealth Organization [facility](WHO). Score between 0-7:  no or low risk or alcohol related problems. Score between 8-15:  moderate risk of alcohol related problems. Score between 16-19:  high risk of alcohol related problems. Score 20 or above:  warrants further diagnostic evaluation for alcohol dependence and treatment.   CLINICAL FACTORS:   Adjustment Disorder   Musculoskeletal: Strength & Muscle Tone: within normal limits Gait & Station: normal Patient leans: N/A  Psychiatric Specialty Exam: Physical Exam  ROS  Blood pressure  128/74, pulse 87, temperature 97.6 F (36.4 C), temperature source Oral, resp. rate 18, height 6' 0.25" (1.835 m), weight 76.3 kg (168 lb 4 oz), SpO2 100 %.Body mass index is 22.66 kg/m.  General Appearance: As in H&P  Eye Contact:    Speech:    Volume:    Mood:    Affect:    Thought Process:    Orientation:    Thought Content:    Suicidal Thoughts:    Homicidal Thoughts:    Memory:    Judgement:  As in H&P  Insight:    Psychomotor Activity:    Concentration:    Recall:    Fund of Knowledge:    Language:    Akathisia:    Handed:    AIMS (if indicated):     Assets:    ADL's:    Cognition:  As in H&P  Sleep:  Number of Hours: 6.75      COGNITIVE FEATURES THAT CONTRIBUTE TO RISK:  None    SUICIDE RISK:   Minimal: No identifiable suicidal ideation.  Patients presenting with no risk factors but with morbid ruminations; may be classified as minimal risk based on the severity of the depressive symptoms  PLAN OF CARE:  As in H&P  I certify that inpatient services furnished can reasonably be expected to improve the patient's condition.   Georgiann CockerVincent A Payden Docter, MD 09/07/2017, 12:36 PM

## 2017-09-07 NOTE — Progress Notes (Signed)
Pt attended morning orientation/goals group and stated today is his first full day and his priority is seeing the doctor for treatment.

## 2017-09-07 NOTE — BHH Group Notes (Signed)
LCSW Group Therapy Note  09/07/2017 1:15pm  Type of Therapy/Topic:  Group Therapy:  Balance in Life  Participation Level: Active  Description of Group:    This group will address the concept of balance and how it feels and looks when one is unbalanced. Patients will be encouraged to process areas in their lives that are out of balance and identify reasons for remaining unbalanced. Facilitators will guide patients in utilizing problem-solving interventions to address and correct the stressor making their life unbalanced. Understanding and applying boundaries will be explored and addressed for obtaining and maintaining a balanced life. Patients will be encouraged to explore ways to assertively make their unbalanced needs known to significant others in their lives, using other group members and facilitator for support and feedback.  Therapeutic Goals: 1. Patient will identify two or more emotions or situations they have that consume much of in their lives. 2. Patient will identify signs/triggers that life has become out of balance:  3. Patient will identify two ways to set boundaries in order to achieve balance in their lives:  4. Patient will demonstrate ability to communicate their needs through discussion and/or role plays  Summary of Patient Progress: Patient appropriately participated in the group discussion. Patient identified friendships as an area of his life that is out of balance. Patient discussed a recent falling out with a close friend and his efforts to make amends by apologizing and giving that friend space.   Therapeutic Modalities:   Cognitive Behavioral Therapy Solution-Focused Therapy Assertiveness Training  Darreld McleanCharlotte C Kynlie Jane, Student-Social Work 09/07/2017 2:32 PM

## 2017-09-07 NOTE — Progress Notes (Signed)
D: Patient denies SI, HI or AVH tonight. Patient presents as pleasant and appropriate.  Pt. states he has been attempting to adjust to the environment today.  Pt. Acknowledges that he may be discharged tomorrow or the day after and is in good spirits about it.  Pt. Denies any physical complaints or any other needs at this time.   A: Patient given emotional support from RN. Patient encouraged to come to staff with concerns and/or questions. Patient's orders and plan of care reviewed.   R: Patient remains appropriate and cooperative. Will continue to monitor patient q15 minutes for safety.

## 2017-09-07 NOTE — BHH Counselor (Signed)
Adult Comprehensive Assessment  Patient ID: Todd [name], male   DOB: Oct 01, 1990, 27 y.o.   MRN: [id number]  Information Source: Information source: Patient  Current Stressors:  Social relationships: Pt reports he feels lonely, does not have a network of friends.  one good friend(Pt reports no other stressors)  Living/Environment/Situation:  Living Arrangements: Parent(sister, brother, nephew) Living conditions (as described by patient or guardian): good environment, everybody gets along How long has patient lived in current situation?: 3 years What is atmosphere in current home: Supportive  Family History:  Marital status: Single Are you sexually active?: No What is your sexual orientation?: heterosexual Has your sexual activity been affected by drugs, alcohol, medication, or emotional stress?: na Does patient have children?: No  Childhood History:  By whom was/is the patient raised?: Both parents Additional childhood history information: Pt parents still married.  Whole family were musicians--travelled. Very positive childhood. Description of patient's relationship with caregiver when they were a child: mom: I was a momma's boy.  dad: good Patient's description of current relationship with people who raised him/her: mom: good, dad: good How were you disciplined when you got in trouble as a child/adolescent?: appropriate Does patient have siblings?: Yes Number of Siblings: 4 Description of patient's current relationship with siblings: 2 sisters, 2 brothers.  (one sister is half sister--only met her 4 times)  "best friends" with other sibs Did patient suffer any verbal/emotional/physical/sexual abuse as a child?: No Did patient suffer from severe childhood neglect?: No Has patient ever been sexually abused/assaulted/raped as an adolescent or adult?: No Was the patient ever a victim of a crime or a disaster?: No Witnessed domestic violence?: No Has patient been effected by domestic  violence as an adult?: No  Education:  Highest grade of school patient has completed: HS diploma--some college.  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 How long has patient been employed?: 3 years Patient's job has been impacted by current illness: No What is the longest time patient has a held a job?: current situation Has patient ever been in the [employer]?: No Are There Guns or Other Weapons in [place]?: No  Financial Resources:   Financial resources: Income from employment, Support from parents / caregiver, Private insurance Does patient have a representative payee or guardian?: No  Alcohol/Substance Abuse:   What has been your use of drugs/alcohol within the last 12 months?: alcohol: pt denies, drugs: pt denies If attempted suicide, did drugs/alcohol play a role in this?: No Alcohol/Substance Abuse Treatment Hx: Denies past history Has alcohol/substance abuse ever caused legal problems?: No  Social Support System:   Pensions consultant Support System: Fair Dietitian Support System: family, one close friend Type of faith/religion: Darrick Meigs How does patient's faith help to cope with current illness?: praying helps, app helps with emotions/Bible verses  Leisure/Recreation:   Leisure and Hobbies: music, movies  Strengths/Needs:   What things does the patient do well?: try to see the positive in everybody, In what areas does patient struggle / problems for patient: lonliness, close friends  Discharge Plan:   Does patient have access to transportation?: Yes Will patient be returning to same living situation after discharge?: Yes Currently receiving community mental health services: No Does patient  have financial barriers related to discharge medications?: No  Summary/Recommendations:  Summary and Recommendations (to be completed by the evaluator): Pt is 27 year old male from [place].  Pt is diagnosed with major depressive disorder and was admitted due to increased depression and suicidal ideation.  Pt only stressor reported is lonliness.  Recommendations for pt include crisis stabilization, therapeutic milieu, attend and participate in groups, medication management, and development of comprhensive mental wellness plan.  Joanne Chars. 09/07/2017

## 2017-09-07 NOTE — Progress Notes (Signed)
Adult Psychoeducational Group Note  Date:  09/07/2017 Time:  8:55 PM  Group Topic/Focus:  Wrap-Up Group:   The focus of this group is to help patients review their daily goal of treatment and discuss progress on daily workbooks.  Participation Level:  Active  Participation Quality:  Appropriate  Affect:  Appropriate  Cognitive:  Appropriate  Insight: Appropriate  Engagement in Group:  Engaged  Modes of Intervention:  Discussion  Additional Comments:  Patient attended wrap-up group and participated.  Savier Trickett W Alexica Schlossberg 09/07/2017, 8:55 PM

## 2017-09-07 NOTE — [facility] Group Notes (Signed)
[facility] Group Notes:  (Nursing/MHT/Case Management/Adjunct)  Date:  09/07/2017  Time:  4:00 p.m.  Type of Therapy:  Psychoeducational Skills  Participation Level:  Active  Participation Quality:  Appropriate  Affect:  Appropriate  Cognitive:  Appropriate  Insight:  Appropriate  Engagement in Group:  Engaged  Modes of Intervention:  Education  Summary of Progress/Problems:  Patient active and participated appropriately in  Group.    Earline MayotteKnight, Casey Fye Shephard 09/07/2017, 5:42 PM

## 2017-09-07 NOTE — Progress Notes (Signed)
DAR NOTE: Patient presents with calm affect and pleasant mood.  Denies suicidal thoughts, auditory and visual hallucinations.  Rates depression at 0, hopelessness at 0, and anxiety at 0.  Maintained on routine safety checks.  Medications given as prescribed.  Support and encouragement offered as needed.  Attended group and participated.  States goal for today is "discharge plan."  Patient observed socializing with peers in the dayroom.  Offered no complaint.

## 2017-09-07 NOTE — H&P (Addendum)
Psychiatric Admission Assessment Adult  Patient Identification: Todd [name] MRN:  [id number] Date of Evaluation:  09/07/2017 Chief Complaint:  Depression with suicidal thoughts. Principal Diagnosis: MDD Diagnosis:   Patient Active Problem List   Diagnosis Date Noted  . MDD (major depressive disorder), severe ([place]) [F32.2] 09/06/2017   History of Present Illness:  27 y.o AAM, single, self employed, lives with his family. No past history of mental illness. Presented to the ER in company of his mother. Reports feeling depressed. Reported to have expresed thoughts of slitting his wrist. Main stressor is recent end of a long term relationship. Routine labs essentially normal except for mild hypokalemia. Toxicology is negative. UDS is negative. BAL<10 mg/dl.  Patient deniesany family history of mental illness. 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 Says he had been in a relationship for over six years. Says they were engaged but broke the engagement a year ago. Says they remained best friends until a week ago. Says they went to the movies last Saturday and he said something he should not have said. Says she has not been talking to her since then. Patient says he continues to have a good relationship with her family. He feels there is nobody he can share things he used to share with her in the past. Says during the first two days, he lost his appetite. He has fully regained appetite. Says he is sleeping normally at night. Patient is dismissive of expressing any suicidal thoughts. Says he was asked if he ever had thoughts of suicide in the past. Repeatedly denies any current thoughts or intent. No past suicidal behavior. No thoughts of violence. No homicidal thoughts. Says he does not feel he needs any medication. He just wants someone he can talk to. Says he has found the unit groups helpful. Denies any other stressors. No legal issues. No substance use.    Total Time spent with patient: 1 hour  Past Psychiatric  History:  No past history of depression. No past history of mania. No past history of psychosis. No past history of suicidal behavior. No past history of violent behavior. He has never been on any psychotropic medication.    Is the patient at risk to self? No.  Has the patient been a risk to self in the past 6 months? No.  Has the patient been a risk to self within the distant past? No.  Is the patient a risk to others? No.  Has the patient been a risk to others in the past 6 months? No.  Has the patient been a risk to others within the distant past? No.   Prior Inpatient Therapy:   Prior Outpatient Therapy:    Alcohol Screening: Patient refused Alcohol Screening Tool: Yes 1. How often do you have a drink containing alcohol?: Never 2. How many drinks containing alcohol do you have on a typical day when you are drinking?: 1 or 2 3. How often do you have six or more drinks on one occasion?: Never AUDIT-C Score: 0 4. How often during the last year have you found that you were not able to stop drinking once you had started?: Never 5. How often during the last year have you failed to do what was normally expected from you becasue of drinking?: Never 6. How often during the last year have you needed a first drink in the morning to get yourself going after a heavy drinking session?: Never 7. How often during the last year have you had a feeling of guilt  of remorse after drinking?: Never 8. How often during the last year have you been unable to remember what happened the night before because you had been drinking?: Never 9. Have you or someone else been injured as a result of your drinking?: No 10. Has a relative or friend or a doctor or another health worker been concerned about your drinking or suggested you cut down?: No Alcohol Use Disorder Identification Test Final Score (AUDIT): 0 Substance Abuse History in the last 12 months:  No. Consequences of Substance Abuse: NA Previous Psychotropic  Medications: No  Psychological Evaluations: No  Past Medical History:  Past Medical History:  Diagnosis Date  . Eczema    History reviewed. No pertinent surgical history. Family History: History reviewed. No pertinent family history. Family Psychiatric  History: Denies any family history of mental illness, substance use disorder or suicide.  Tobacco Screening: Have you used any form of tobacco in the last 30 days? (Cigarettes, Smokeless Tobacco, Cigars, and/or Pipes): No Social History:  Social History   Substance and Sexual Activity  Alcohol Use No     Social History   Substance and Sexual Activity  Drug Use No    Additional Social History: Marital status: Single Are you sexually active?: No What is your sexual orientation?: heterosexual Has your sexual activity been affected by drugs, alcohol, medication, or emotional stress?: na Does patient have children?: No        Allergies:  No Known Allergies Lab Results:  Results for orders placed or performed during the hospital encounter of 09/06/17 (from the past 48 hour(s))  Rapid urine drug screen (hospital performed)     Status: None   Collection Time: 09/06/17  1:02 AM  Result Value Ref Range   Opiates NONE DETECTED NONE DETECTED   Cocaine NONE DETECTED NONE DETECTED   Benzodiazepines NONE DETECTED NONE DETECTED   Amphetamines NONE DETECTED NONE DETECTED   Tetrahydrocannabinol NONE DETECTED NONE DETECTED   Barbiturates NONE DETECTED NONE DETECTED    Comment: (NOTE) DRUG SCREEN FOR MEDICAL PURPOSES ONLY.  IF CONFIRMATION IS NEEDED FOR ANY PURPOSE, NOTIFY LAB WITHIN 5 DAYS. LOWEST DETECTABLE LIMITS FOR URINE DRUG SCREEN Drug Class                     Cutoff (ng/mL) Amphetamine and metabolites    1000 Barbiturate and metabolites    200 Benzodiazepine                 301 Tricyclics and metabolites     300 Opiates and metabolites        300 Cocaine and metabolites        300 THC                             50 Performed at [facility], [place] [address], [place], [place] [address]   Comprehensive metabolic panel     Status: Abnormal   Collection Time: 09/06/17  1:04 AM  Result Value Ref Range   Sodium 137 135 - 145 mmol/L   Potassium 3.3 (L) 3.5 - 5.1 mmol/L   Chloride 101 101 - 111 mmol/L   CO2 24 22 - 32 mmol/L   Glucose, Bld 87 65 - 99 mg/dL   BUN 12 6 - 20 mg/dL   Creatinine, Ser 1.26 (H) 0.61 - 1.24 mg/dL   Calcium 9.3 8.9 - 10.3 mg/dL   Total Protein 7.6 6.5 - 8.1  g/dL   Albumin 4.4 3.5 - 5.0 g/dL   AST 21 15 - 41 U/L   ALT 20 17 - 63 U/L   Alkaline Phosphatase 61 38 - 126 U/L   Total Bilirubin 2.8 (H) 0.3 - 1.2 mg/dL   GFR calc non Af Amer >60 >60 mL/min   GFR calc Af Amer >60 >60 mL/min    Comment: (NOTE) The eGFR has been calculated using the CKD EPI equation. This calculation has not been validated in all clinical situations. eGFR's persistently <60 mL/min signify possible Chronic Kidney Disease.    Anion gap 12 5 - 15    Comment: Performed at [facility] [address] [place], [place] [address]  Ethanol     Status: None   Collection Time: 09/06/17  1:04 AM  Result Value Ref Range   Alcohol, Ethyl (B) <10 <10 mg/dL    Comment:        LOWEST DETECTABLE LIMIT FOR SERUM ALCOHOL IS 10 mg/dL FOR MEDICAL PURPOSES ONLY Performed at [facility], [place] [address], [place], [place] [address]   Salicylate level     Status: None   Collection Time: 09/06/17  1:04 AM  Result Value Ref Range   Salicylate Lvl <0.9 2.8 - 30.0 mg/dL    Comment: Performed at [facility] [address] [place], [place] [address]  Acetaminophen level     Status: Abnormal   Collection Time: 09/06/17  1:04 AM  Result Value Ref Range   Acetaminophen (Tylenol), Serum <10 (L) 10 - 30 ug/mL    Comment:        THERAPEUTIC CONCENTRATIONS VARY SIGNIFICANTLY. A RANGE OF 10-30 ug/mL MAY BE AN EFFECTIVE CONCENTRATION FOR MANY PATIENTS. HOWEVER, SOME ARE BEST TREATED AT  CONCENTRATIONS OUTSIDE THIS RANGE. ACETAMINOPHEN CONCENTRATIONS >150 ug/mL AT 4 HOURS AFTER INGESTION AND >50 ug/mL AT 12 HOURS AFTER INGESTION ARE OFTEN ASSOCIATED WITH TOXIC REACTIONS. Performed at [facility], [place] [address], [place], [place] [address]   cbc     Status: None   Collection Time: 09/06/17  1:04 AM  Result Value Ref Range   WBC 6.0 4.0 - 10.5 K/uL   RBC 5.45 4.22 - 5.81 MIL/uL   Hemoglobin 15.5 13.0 - 17.0 g/dL   HCT 44.9 39.0 - 52.0 %   MCV 82.4 78.0 - 100.0 fL   MCH 28.4 26.0 - 34.0 pg   MCHC 34.5 30.0 - 36.0 g/dL   RDW 13.1 11.5 - 15.5 %   Platelets 205 150 - 400 K/uL    Comment: Performed at [facility], [place] [address] [place], [place] [address]    Blood Alcohol level:  Lab Results  Component Value Date   ETH <10 08/65/7846    Metabolic Disorder Labs:  No results found for: HGBA1C, MPG No results found for: PROLACTIN No results found for: CHOL, TRIG, HDL, CHOLHDL, VLDL, LDLCALC  Current Medications: Current Facility-Administered Medications  Medication Dose Route Frequency Provider Last Rate Last Dose  . alum & mag hydroxide-simeth (MAALOX/MYLANTA) 200-200-20 MG/5ML suspension 30 mL  30 mL Oral Q4H PRN Rankin, Shuvon B, NP      . magnesium hydroxide (MILK OF MAGNESIA) suspension 30 mL  30 mL Oral Daily PRN Rankin, Shuvon B, NP       PTA Medications: No medications prior to admission.    Musculoskeletal: Strength & Muscle Tone: within normal limits Gait & Station: normal Patient leans: N/A  Psychiatric Specialty Exam:  Physical Exam  Constitutional: He is oriented to person, place, and time. He appears well-developed and well-nourished.  HENT:  Head: Normocephalic and atraumatic.  Respiratory: Effort normal and breath sounds normal.  Neurological: He is alert and oriented to person, place, and time.  Psychiatric:  As above     ROS  Blood pressure 128/74, pulse 87, temperature 97.6 F (36.4 C), temperature source Oral,  resp. rate 18, height 6' 0.25" (1.835 m), weight 76.3 kg (168 lb 4 oz), SpO2 100 %.Body mass index is 22.66 kg/m.  General Appearance: Neat and Well Groomed  Eye Contact:  Good  Speech:  Clear and Coherent and Normal Rate  Volume:  Normal  Mood:  Euthymic  Affect:  Appropriate and Full Range  Thought Process:  Linear  Orientation:  Full (Time, Place, and Person)  Thought Content:  Less ruminations about his last relationship. No delusional theme. No preoccupation with violent thoughts. No obsession.  No hallucination in any modality.   Suicidal Thoughts:  No  Homicidal Thoughts:  No  Memory:  Immediate;   Good Recent;   Good Remote;   Good  Judgement:  Good  Insight:  Good  Psychomotor Activity:  Normal  Concentration:  Concentration: Good and Attention Span: Good  Recall:  Good  Fund of Knowledge:  Good  Language:  Good  Akathisia:  Negative  Handed:    AIMS (if indicated):     Assets:  Communication Skills Desire for Improvement Housing Leisure Time Physical Health Resilience Social Support Talents/Skills Transportation Vocational/Educational  ADL's:  Intact  Cognition:  WNL  Sleep:  Number of Hours: 6.75    Treatment Plan Summary: Patient is presenting with adjustment disorder in context of recent break up. He denies any dangerousness towards self or others. He does not appear depressed. He is not psychotic or manic. We have agreed to evaluate him and gather collateral from his family.   Psychiatric: ??MDD Adjustment Disorder   Medical:  Psychosocial:  Relationship issues    PLAN:  1. PRN for behavioral issues or anxiety 2. Encourage unit groups and therapeutic activities 3. Monitor mood, behavior and interaction with peers 4. SW would gather collateral from his family and coordinate aftercare       Observation Level/Precautions:  15 minute checks  Laboratory:    Psychotherapy:    Medications:    Consultations:    Discharge Concerns:     Estimated LOS:  Other:     Physician Treatment Plan for Primary Diagnosis: <principal problem not specified> Long Term Goal(s): Improvement in symptoms so as ready for discharge  Short Term Goals: Ability to identify changes in lifestyle to reduce recurrence of condition will improve, Ability to verbalize feelings will improve, Ability to disclose and discuss suicidal ideas, Ability to demonstrate self-control will improve, Ability to identify and develop effective coping behaviors will improve, Ability to maintain clinical measurements within normal limits will improve and Compliance with prescribed medications will improve  Physician Treatment Plan for Secondary Diagnosis: Active Problems:   MDD (major depressive disorder), severe ([employer])  Long Term Goal(s): Improvement in symptoms so as ready for discharge  Short Term Goals: Ability to identify changes in lifestyle to reduce recurrence of condition will improve, Ability to verbalize feelings will improve, Ability to disclose and discuss suicidal ideas, Ability to demonstrate self-control will improve, Ability to identify and develop effective coping behaviors will improve, Ability to maintain clinical measurements within normal limits will improve and Compliance with prescribed medications will improve  I  certify that inpatient services furnished can reasonably be expected to improve the patient's condition.    Artist [place], MD 3/14/201912:01 PM

## 2017-09-08 DIAGNOSIS — F4320 Adjustment disorder, unspecified: Secondary | ICD-10-CM

## 2017-09-08 DIAGNOSIS — F17210 Nicotine dependence, cigarettes, uncomplicated: Secondary | ICD-10-CM

## 2017-09-08 NOTE — Progress Notes (Signed)
Adult Psychoeducational Group Note  Date:  09/08/2017 Time:  8:49 PM  Group Topic/Focus:  Wrap-Up Group:   The focus of this group is to help patients review their daily goal of treatment and discuss progress on daily workbooks.  Participation Level:  Active  Participation Quality:  Appropriate  Affect:  Appropriate  Cognitive:  Appropriate  Insight: Appropriate  Engagement in Group:  Engaged  Modes of Intervention:  Discussion  Additional Comments:  Patient attended group and participated.   Erendida Wrenn W Bairon Klemann 09/08/2017, 8:49 PM

## 2017-09-08 NOTE — Progress Notes (Signed)
Recreation Therapy Notes  Date: 3.15.19 Time: 9:30 a.m. Location: [address] Dayroom  Group Topic: Stress Management  Goal Area(s) Addresses:  Goal 1.1: To reduce stress  -Patient will report feeling a reduction in stress level  -Patient will identify the importance of stress management  -Patient will participate during stress management group treatment    Intervention: Stress Management  Activity: Yoga- Recreation Therapy Intern instructed a yoga group. Patients were in a peaceful environment with soft lighting enhancing patients mood.   Education: Stress Management, Discharge Planning.   Education Outcome: Acknowledges edcuation/In group clarification offered/Needs additional education  Clinical Observations/Feedback:: Patient did not attend    Sheryle HailDarian Marsena Taff, Recreation Therapy Intern   Sheryle HailDarian Sharne Linders 09/08/2017 8:23 AM

## 2017-09-08 NOTE — [facility] Suicide Risk Assessment (Signed)
[facility] INPATIENT:  Family/Significant Other Suicide Prevention Education  Suicide Prevention Education:  Education Completed; Todd [name], mother, [phone], has been identified by the patient as the family member/significant other with whom the patient will be residing, and identified as the person(s) who will aid the patient in the event of a mental health crisis (suicidal ideations/suicide attempt).  With written consent from the patient, the family member/significant other has been provided the following suicide prevention education, prior to the and/or following the discharge of the patient.  The suicide prevention education provided includes the following:  Suicide risk factors  Suicide prevention and interventions  National Suicide Hotline telephone number  [facility] assessment telephone number  [facility] Emergency Assistance 911  [facility] and/or Residential Mobile Crisis Unit telephone number  Request made of family/significant other to:  Remove weapons (e.g., guns, rifles, knives), all items previously/currently identified as safety concern.  No guns in the home, per Todd.  Remove drugs/medications (over-the-counter, prescriptions, illicit drugs), all items previously/currently identified as a safety concern. Todd reports they get rid of unneeded medications.  The family member/significant other verbalizes understanding of the suicide prevention education information provided.  The family member/significant other agrees to remove the items of safety concern listed above.  Todd reports that pt was engaged to someone and she broke it off some time ago, however, pt has continued to be friends with her and even to spend time with her family.  Something occurred between the two of them over the weekend that seemed to end the friendship and pt has been very upset, which he did share with his parents over the weekend.  Todd thinks having pt get involved in therapy  would be helpful.  They do have counseling at their church but she agrees that an outside therapist may be better as pt needs a little more distance currently.  Todd [name], Todd Seats Jon, LCSW 09/08/2017, 10:47 AM

## 2017-09-08 NOTE — Progress Notes (Signed)
  [facility] Dis[facility] :  Will you be returning to the same living situation after discharge:  Yes,  with family At discharge, do you have transportation home?: Yes,  parents Do you have the ability to pay for your medications: Yes,  [employer]  Release of information consent forms completed and in the chart;  Patient's signature needed at discharge.  Patient to Follow up at: Follow-up Information    [facility] Follow up.   Why:  Your social worker, Tammy SoursGreg, will contact you on Monday with an appointment time to begin therapy. Contact information: [address] [place] [address] [phone]           Next level of care provider has access to [facility] Link:no  Safety Planning and Suicide Prevention discussed: Yes,  with mother  Have you used any form of tobacco in the last 30 days? (Cigarettes, Smokeless Tobacco, Cigars, and/or Pipes): No  Has patient been referred to the Quitline?: N/A patient is not a smoker  Patient has been referred for addiction treatment: Yes  Lorri FrederickWierda, Aftyn Nott Jon, LCSW 09/08/2017, 3:38 PM

## 2017-09-08 NOTE — Plan of Care (Signed)
  Completed/Met Activity: Interest or engagement in activities will improve 09/08/2017 1630 - Completed/Met by Joice Lofts, RN Sleeping patterns will improve 09/08/2017 1630 - Completed/Met by Joice Lofts, RN Education: Knowledge of [place] Education information/materials will improve 09/08/2017 1630 - Completed/Met by Joice Lofts, RN Emotional status will improve 09/08/2017 1630 - Completed/Met by Joice Lofts, RN Mental status will improve 09/08/2017 1630 - Completed/Met by Joice Lofts, RN Verbalization of understanding the information provided will improve 09/08/2017 1630 - Completed/Met by Joice Lofts, RN

## 2017-09-08 NOTE — Progress Notes (Signed)
D: Patient denies any depressive symptoms or thoughts of self harm.  He is pleasant with staff and peers.  Patient's goal today is to "get discharged today."  Explained to patient that he is not scheduled for discharge today.  He appears to be doing well; he is smiling and bright.  He is sleeping and eating well; his energy level is normal and his concentration is good.  Patient is attending groups and is engaged.  A: Continue to monitor medication management and MD orders.  Safety checks completed every 15 minutes per protocol.  Offer support and encouragement as needed.  R: Patient is receptive to staff; his behavior is appropriate.

## 2017-09-08 NOTE — [facility] Group Notes (Signed)
  [facility]  Date/Time: 09/08/17, 1315  Type of Therapy/Topic:  Group Therapy:  Emotion Regulation  Participation Level:  Active   Mood:pleasant  Description of Group:    The purpose of this group is to assist patients in learning to regulate negative emotions and experience positive emotions. Patients will be guided to discuss ways in which they have been vulnerable to their negative emotions. These vulnerabilities will be juxtaposed with experiences of positive emotions or situations, and patients challenged to use positive emotions to combat negative ones. Special emphasis will be placed on coping with negative emotions in conflict situations, and patients will process healthy conflict resolution skills.  Therapeutic Goals: 1. Patient will identify two positive emotions or experiences to reflect on in order to balance out negative emotions:  2. Patient will label two or more emotions that they find the most difficult to experience:  3. Patient will be able to demonstrate positive conflict resolution skills through discussion or role plays:   Summary of Patient Progress: Pt identified stress and loneliness as emotions that are difficult for him to experience.  Pt was active in group discussion regarding positive ways to manage difficult emotions.  Good participation and pt smiles and seems to be doing well.       Therapeutic Modalities:   Cognitive Behavioral Therapy Feelings Identification Dialectical Behavioral Therapy  Todd SquibbGreg Billie Trager, LCSW

## 2017-09-08 NOTE — Progress Notes (Signed)
[facility]  09/08/2017 3:02 PM Theophilus KindsMicah Gibby  MRN:  [id number]   Subjective:  Patient reports that he is feeling really good and has had time to thing about what has happened. He reports that he plans to discharge tomorrow and he will follow up with a therapist. "I think it happened because I didn't have anyone extra to talk to." He denies any SI/HI/AVH and contracts for safety. He is still refusing any medications at this time.  Objective: Patient's chart and findings reviewed and discussed with treatment team. Patient presents in the day room interacting with peers and staff appropriately. He has been pleasant and cooperative and has had an appropriate affect on the unit. He has plans to follow up with a therapist. CSW is arranging appointments for therapy.   Principal Problem: MDD (major depressive disorder), severe (HCC) Diagnosis:   Patient Active Problem List   Diagnosis Date Noted  . Adjustment disorder, unspecified [F43.20] 09/07/2017  . MDD (major depressive disorder), severe (HCC) [F32.2] 09/06/2017   Total Time spent with patient: 15 minutes  Past Psychiatric History: See H&P  Past Medical History:  Past Medical History:  Diagnosis Date  . Eczema    History reviewed. No pertinent surgical history. Family History: History reviewed. No pertinent family history. Family Psychiatric  History: See H&P Social History:  Social History   Substance and Sexual Activity  Alcohol Use No     Social History   Substance and Sexual Activity  Drug Use No    Social History   Socioeconomic History  . Marital status: Single    Spouse name: None  . Number of children: None  . Years of education: None  . Highest education level: None  Social Needs  . Financial resource strain: None  . Food insecurity - worry: None  . Food insecurity - inability: None  . Transportation needs - medical: None  . Transportation needs - non-medical: None  Occupational History  . None   Tobacco Use  . Smoking status: Current Every Day Smoker  . Smokeless tobacco: Never Used  Substance and Sexual Activity  . Alcohol use: No  . Drug use: No  . Sexual activity: No  Other Topics Concern  . None  Social History Narrative  . None   Additional Social History:                         Sleep: Good  Appetite:  Good  Current Medications: Current Facility-Administered Medications  Medication Dose Route Frequency Provider Last Rate Last Dose  . alum & mag hydroxide-simeth (MAALOX/MYLANTA) 200-200-20 MG/5ML suspension 30 mL  30 mL Oral Q4H PRN Rankin, Shuvon B, NP      . magnesium hydroxide (MILK OF MAGNESIA) suspension 30 mL  30 mL Oral Daily PRN Rankin, Shuvon B, NP        Lab Results: No results found for this or any previous visit (from the past 48 hour(s)).  Blood Alcohol level:  Lab Results  Component Value Date   ETH <10 09/06/2017    Metabolic Disorder Labs: No results found for: HGBA1C, MPG No results found for: PROLACTIN No results found for: CHOL, TRIG, HDL, CHOLHDL, VLDL, LDLCALC  Physical Findings: AIMS: Facial and Oral Movements Muscles of Facial Expression: None, normal Lips and Perioral Area: None, normal Jaw: None, normal Tongue: None, normal,Extremity Movements Upper (arms, wrists, hands, fingers): None, normal Lower (legs, knees, ankles, toes): None, normal, Trunk Movements Neck, shoulders, hips:  None, normal, Overall Severity Severity of abnormal movements (highest score from questions above): None, normal Incapacitation due to abnormal movements: None, normal Patient's awareness of abnormal movements (rate only patient's report): No Awareness, Dental Status Current problems with teeth and/or dentures?: No Does patient usually wear dentures?: No  CIWA:    COWS:     Musculoskeletal: Strength & Muscle Tone: within normal limits Gait & Station: normal Patient leans: N/A  Psychiatric Specialty Exam: Physical Exam  Nursing  note and vitals reviewed. Constitutional: He is oriented to person, place, and time. He appears well-developed and well-nourished.  Cardiovascular: Normal rate.  Respiratory: Effort normal.  Musculoskeletal: Normal range of motion.  Neurological: He is alert and oriented to person, place, and time.  Skin: Skin is warm.    Review of Systems  Constitutional: Negative.   HENT: Negative.   Eyes: Negative.   Respiratory: Negative.   Cardiovascular: Negative.   Gastrointestinal: Negative.   Genitourinary: Negative.   Musculoskeletal: Negative.   Skin: Negative.   Neurological: Negative.   Endo/Heme/Allergies: Negative.   Psychiatric/Behavioral: Negative.     Blood pressure 128/67, pulse 73, temperature 97.7 F (36.5 C), temperature source Oral, resp. rate 18, height 6' 0.25" (1.835 m), weight 76.3 kg (168 lb 4 oz), SpO2 100 %.Body mass index is 22.66 kg/m.  General Appearance: Casual  Eye Contact:  Good  Speech:  Clear and Coherent and Normal Rate  Volume:  Normal  Mood:  Euthymic  Affect:  Appropriate  Thought Process:  Goal Directed and Descriptions of Associations: Intact  Orientation:  Full (Time, Place, and Person)  Thought Content:  WDL  Suicidal Thoughts:  No  Homicidal Thoughts:  No  Memory:  Immediate;   Good Recent;   Good Remote;   Good  Judgement:  Good  Insight:  Good  Psychomotor Activity:  Normal  Concentration:  Concentration: Good and Attention Span: Good  Recall:  Good  Fund of Knowledge:  Good  Language:  Good  Akathisia:  No  Handed:  Right  AIMS (if indicated):     Assets:  Communication Skills Desire for Improvement Financial Resources/Insurance Housing Physical Health Social Support Transportation  ADL's:  Intact  Cognition:  WNL  Sleep:  Number of Hours: 6.75   Problems Addressed: MDD severe Adjustment disorder  Treatment Plan Summary: Daily contact with patient to assess and evaluate symptoms and progress in treatment and Plan is to:   -Encourage group therapy participation -CSW to arrange appointments   Maryfrances Bunnell, FNP 09/08/2017, 3:02 PM

## 2017-09-08 NOTE — Tx Team (Signed)
Interdisciplinary Treatment and Diagnostic Plan Update  09/08/2017 Time of Session: 0920 Todd KindsMicah [name] MRN: [id number]  Principal Diagnosis: MDD (major depressive disorder), severe (HCC)  Secondary Diagnoses: Principal Problem:   MDD (major depressive disorder), severe (HCC) Active Problems:   Adjustment disorder, unspecified   Current Medications:  Current Facility-Administered Medications  Medication Dose Route Frequency Provider Last Rate Last Dose  . alum & mag hydroxide-simeth (MAALOX/MYLANTA) 200-200-20 MG/5ML suspension 30 mL  30 mL Oral Q4H PRN Rankin, Shuvon B, NP      . magnesium hydroxide (MILK OF MAGNESIA) suspension 30 mL  30 mL Oral Daily PRN Rankin, Shuvon B, NP       PTA Medications: No medications prior to admission.    Patient Stressors: Financial difficulties Substance abuse Other: Feelings of loneliness and isolation  Patient Strengths: Ability for insight Average or above average intelligence Motivation for treatment/growth Supportive family/friends  Treatment Modalities: Medication Management, Group therapy, Case management,  1 to 1 session with clinician, Psychoeducation, Recreational therapy.   Physician Treatment Plan for Primary Diagnosis: MDD (major depressive disorder), severe (HCC) Long Term Goal(s): Improvement in symptoms so as ready for discharge Improvement in symptoms so as ready for discharge   Short Term Goals: Ability to identify changes in lifestyle to reduce recurrence of condition will improve Ability to verbalize feelings will improve Ability to disclose and discuss suicidal ideas Ability to demonstrate self-control will improve Ability to identify and develop effective coping behaviors will improve Ability to maintain clinical measurements within normal limits will improve Compliance with prescribed medications will improve Ability to identify changes in lifestyle to reduce recurrence of condition will improve Ability to verbalize  feelings will improve Ability to disclose and discuss suicidal ideas Ability to demonstrate self-control will improve Ability to identify and develop effective coping behaviors will improve Ability to maintain clinical measurements within normal limits will improve Compliance with prescribed medications will improve  Medication Management: Evaluate patient's response, side effects, and tolerance of medication regimen.  Therapeutic Interventions: 1 to 1 sessions, Unit Group sessions and Medication administration.  Evaluation of Outcomes: Progressing  Physician Treatment Plan for Secondary Diagnosis: Principal Problem:   MDD (major depressive disorder), severe (HCC) Active Problems:   Adjustment disorder, unspecified  Long Term Goal(s): Improvement in symptoms so as ready for discharge Improvement in symptoms so as ready for discharge   Short Term Goals: Ability to identify changes in lifestyle to reduce recurrence of condition will improve Ability to verbalize feelings will improve Ability to disclose and discuss suicidal ideas Ability to demonstrate self-control will improve Ability to identify and develop effective coping behaviors will improve Ability to maintain clinical measurements within normal limits will improve Compliance with prescribed medications will improve Ability to identify changes in lifestyle to reduce recurrence of condition will improve Ability to verbalize feelings will improve Ability to disclose and discuss suicidal ideas Ability to demonstrate self-control will improve Ability to identify and develop effective coping behaviors will improve Ability to maintain clinical measurements within normal limits will improve Compliance with prescribed medications will improve     Medication Management: Evaluate patient's response, side effects, and tolerance of medication regimen.  Therapeutic Interventions: 1 to 1 sessions, Unit Group sessions and Medication  administration.  Evaluation of Outcomes: Progressing   RN Treatment Plan for Primary Diagnosis: MDD (major depressive disorder), severe (HCC) Long Term Goal(s): Knowledge of disease and therapeutic regimen to maintain health will improve  Short Term Goals: Ability to identify and develop effective coping behaviors will improve  and Compliance with prescribed medications will improve  Medication Management: RN will administer medications as ordered by provider, will assess and evaluate patient's response and provide education to patient for prescribed medication. RN will report any adverse and/or side effects to prescribing provider.  Therapeutic Interventions: 1 on 1 counseling sessions, Psychoeducation, Medication administration, Evaluate responses to treatment, Monitor vital signs and CBGs as ordered, Perform/monitor CIWA, COWS, AIMS and Fall Risk screenings as ordered, Perform wound care treatments as ordered.  Evaluation of Outcomes: Progressing   LCSW Treatment Plan for Primary Diagnosis: MDD (major depressive disorder), severe (HCC) Long Term Goal(s): Safe transition to appropriate next level of care at discharge, Engage patient in therapeutic group addressing interpersonal concerns.  Short Term Goals: Engage patient in aftercare planning with referrals and resources, Increase social support and Increase skills for wellness and recovery  Therapeutic Interventions: Assess for all discharge needs, 1 to 1 time with Social worker, Explore available resources and support systems, Assess for adequacy in community support network, Educate family and significant other(s) on suicide prevention, Complete Psychosocial Assessment, Interpersonal group therapy.  Evaluation of Outcomes: Progressing   Progress in Treatment: Attending groups: Yes. Participating in groups: Yes. Taking medication as prescribed: No. Toleration medication: No. Family/Significant other contact made: No, will contact:   father Patient understands diagnosis: Yes. Discussing patient identified problems/goals with staff: Yes. Medical problems stabilized or resolved: Yes. Denies suicidal/homicidal ideation: Yes. Issues/concerns per patient self-inventory: No. Other: none  New problem(s) identified: No, Describe:  none  New Short Term/Long Term Goal(s):Pt goal: "to learn the best way to cope with stuff."  Discharge Plan or Barriers:   Reason for Continuation of Hospitalization: Depression  Estimated Length of Stay: 1-3 days.  Attendees: Patient:Todd [name] 09/08/2017   Physician: Dr Jackquline Berlin, MD 09/08/2017   Nursing: Joslyn Devon, RN 09/08/2017   RN Care Manager: 09/08/2017   Social Worker: Daleen Squibb, LCSW 09/08/2017   Recreational Therapist:  09/08/2017   Other: Enid Cutter, intern 09/08/2017   Other:  09/08/2017   Other: 09/08/2017         Scribe for Treatment Team: Lorri Frederick, LCSW 09/08/2017 10:24 AM

## 2017-09-08 NOTE — Progress Notes (Signed)
DATA ACTION RESPONSE  Objective- Pt. is visible in the dayroom, seen interacting with peers.  Presents with an animated affect and mood. Pt states he hjopes to be d/c tomorrow.Pleasant on the unit.  Subjective- Denies having any SI/HI/AVH/Pain at this time.Is cooperative and remains safe on the unit.  1:1 interaction in private to establish rapport. Encouragement, education, & support given from staff.     Safety maintained with Q 15 checks. Continue with POC.

## 2017-09-09 NOTE — Progress Notes (Signed)
D: Pt A & O X4. Denies SI, HI, AVH and pain when assessed. Presents animated, pleasant with logical speech on interactions. Rates his depression, hopelessness and anxiety all 0/10 on self inventory sheet. Reports fair sleep, good appetite with normal energy and good concentration level. Pt was d/c home as ordered. Picked up in lobby by his mother.   A: Emotional support and encouragement provided to pt. D/C instructions reviewed with pt including follow up appointment.  Compliance encouraged.  All belongings in locker 36 returned to pt at time of departure.  R: Pt verbalized understanding related to d/c instructions. Signed belonging sheet in agreement with items received. Vitals WNL. Pt ambulatory with a steady gait. Appears to be in no physical distress at time of d/c.

## 2017-09-09 NOTE — Discharge Summary (Signed)
Physician Discharge Summary Note  Patient:  Todd [name] is an 27 y.o., male MRN:  [id number] DOB:  [birth year] Patient phone:  [phone] (home)  Patient address:   [place] [address],  Total Time spent with patient: 45 minutes  Date of Admission:  09/06/2017 Date of Discharge: 09/09/2017  Reason for Admission:  Depression with suicidal ideations  Principal Problem: MDD (major depressive disorder), severe [facility]) Discharge Diagnoses: Patient Active Problem List   Diagnosis Date Noted  . MDD (major depressive disorder), severe ([place]) [F32.2] 09/06/2017    Priority: High    Past Psychiatric History: none  Past Medical History:  Past Medical History:  Diagnosis Date  . Eczema    History reviewed. No pertinent surgical history. Family History: History reviewed. No pertinent family history. Family Psychiatric  History: none Social History:  Social History   Substance and Sexual Activity  Alcohol Use No     Social History   Substance and Sexual Activity  Drug Use No    Social History   Socioeconomic History  . Marital status: Single    Spouse name: None  . Number of children: None  . Years of education: None  . Highest education level: None  Social Needs  . Financial resource strain: None  . Food insecurity - worry: None  . Food insecurity - inability: None  . Transportation needs - medical: None  . Transportation needs - non-medical: None  Occupational History  . None  Tobacco Use  . Smoking status: Current Every Day Smoker  . Smokeless tobacco: Never Used  Substance and Sexual Activity  . Alcohol use: No  . Drug use: No  . Sexual activity: No  Other Topics Concern  . None  Social History Narrative  . None    Hospital Course:  On admission:  27 y.o AAM, single, self employed, lives with his family. No past history of mental illness. Presented to the ER in company of his mother. Reports feeling depressed. Reported to have expresed  thoughts of slitting his wrist. Main stressor is recent end of a long term relationship. Routine labs essentially normal except for mild hypokalemia. Toxicology is negative. UDS is negative. BAL<10 mg/dl.  Patient deniesany family history of mental illness. 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 Says he had been in a relationship for over six years. Says they were engaged but broke the engagement a year ago. Says they remained best friends until a week ago. Says they went to the movies last Saturday and he said something he should not have said. Says she has not been talking to her since then. Patient says he continues to have a good relationship with her family. He feels there is nobody he can share things he used to share with her in the past. Says during the first two days, he lost his appetite. He has fully regained appetite. Says he is sleeping normally at night. Patient is dismissive of expressing any suicidal thoughts. Says he was asked if he ever had thoughts of suicide in the past. Repeatedly denies any current thoughts or intent. No past suicidal behavior. No thoughts of violence. No homicidal thoughts. Says he does not feel he needs any medication. He just wants someone he can talk to. Says he has found the unit groups helpful. Denies any other stressors. No legal issues. No substance use.    No medications started.  09/08/2017:  Patient reports that he is feeling really good and has had time to thing about  what has happened. He reports that he plans to discharge tomorrow and he will follow up with a therapist. "I think it happened because I didn't have anyone extra to talk to." He denies any SI/HI/AVH and contracts for safety. He is still refusing any medications at this time.  Patient's chart and findings reviewed and discussed with treatment team. Patient presents in the day room interacting with peers and staff appropriately. He has been pleasant and cooperative and has had an appropriate affect on the  unit. He has plans to follow up with a therapist. CSW is arranging appointments for therapy.  09/09/2017:  Patient has met maximum benefits of hospitalization.  Denies suicidal/homicidal ideations, hallucinations, and substance abuse.  Discharge instructions provided with explanations along with Rx, crisis numbers, and follow-up appointment.  Physical Findings: AIMS: Facial and Oral Movements Muscles of Facial Expression: None, normal Lips and Perioral Area: None, normal Jaw: None, normal Tongue: None, normal,Extremity Movements Upper (arms, wrists, hands, fingers): None, normal Lower (legs, knees, ankles, toes): None, normal, Trunk Movements Neck, shoulders, hips: None, normal, Overall Severity Severity of abnormal movements (highest score from questions above): None, normal Incapacitation due to abnormal movements: None, normal Patient's awareness of abnormal movements (rate only patient's report): No Awareness, Dental Status Current problems with teeth and/or dentures?: No Does patient usually wear dentures?: No  CIWA:    COWS:     Musculoskeletal: Strength & Muscle Tone: within normal limits Gait & Station: normal Patient leans: N/A  Psychiatric Specialty Exam: Physical Exam  Constitutional: He is oriented to person, place, and time. He appears well-developed and well-nourished.  HENT:  Head: Normocephalic.  Neck: Normal range of motion.  Respiratory: Effort normal.  Musculoskeletal: Normal range of motion.  Neurological: He is alert and oriented to person, place, and time.  Psychiatric: He has a normal mood and affect. His speech is normal and behavior is normal. Judgment and thought content normal. Cognition and memory are normal.    Review of Systems  Psychiatric/Behavioral: The patient is nervous/anxious.   All other systems reviewed and are negative.   Blood pressure 114/64, pulse (!) 52, temperature 98.3 F (36.8 C), temperature source Oral, resp. rate 16, height 6'  0.25" (1.835 m), weight 76.3 kg (168 lb 4 oz), SpO2 100 %.Body mass index is 22.66 kg/m.  General Appearance: Casual  Eye Contact:  Good  Speech:  Normal Rate  Volume:  Normal  Mood:  Anxious, mild  Affect:  Congruent  Thought Process:  Coherent and Descriptions of Associations: Intact  Orientation:  Full (Time, Place, and Person)  Thought Content:  WDL and Logical  Suicidal Thoughts:  No  Homicidal Thoughts:  No  Memory:  Immediate;   Good Recent;   Good Remote;   Good  Judgement:  Good  Insight:  Good  Psychomotor Activity:  Normal  Concentration:  Concentration: Good and Attention Span: Good  Recall:  Good  Fund of Knowledge:  Good  Language:  Good  Akathisia:  No  Handed:  Right  AIMS (if indicated):     Assets:  Housing Leisure Time Physical Health Resilience Social Support  ADL's:  Intact  Cognition:  WNL  Sleep:  Number of Hours: 5.75     Have you used any form of tobacco in the last 30 days? (Cigarettes, Smokeless Tobacco, Cigars, and/or Pipes): No  Has this patient used any form of tobacco in the last 30 days? (Cigarettes, Smokeless Tobacco, Cigars, and/or Pipes) No, N/A  Blood Alcohol level:  Lab Results  Component Value Date   ETH <10 35/45/6256    Metabolic Disorder Labs:  No results found for: HGBA1C, MPG No results found for: PROLACTIN No results found for: CHOL, TRIG, HDL, CHOLHDL, VLDL, LDLCALC  See Psychiatric Specialty Exam and Suicide Risk Assessment completed by Attending Physician prior to discharge.  Discharge destination:  Home  Is patient on multiple antipsychotic therapies at discharge:  No   Has Patient had three or more failed trials of antipsychotic monotherapy by history:  No  Recommended Plan for Multiple Antipsychotic Therapies: NA  Discharge Instructions    Diet - low sodium heart healthy   Complete by:  As directed    Discharge instructions   Complete by:  As directed    Discharge home   Increase activity slowly    Complete by:  As directed      Allergies as of 09/09/2017   No Known Allergies     Medication List    You have not been prescribed any medications.    Follow-up [place] Follow up.   Why:  Your social worker, Marya Amsler, will contact you on Monday with an appointment time to begin therapy. Contact information: [address] [place] [address] [phone]           Follow-up recommendations:  Activity:  as tolerated Diet:  heart healthy diet  Comments:  Follow-up with [facility].  SignedWaylan Boga, NP 09/09/2017, 10:32 AM

## 2017-09-09 NOTE — [facility] Group Notes (Signed)
LCSW Group Therapy Note  09/09/2017   9:30-10:30am (300 hall)                10:30-11:30am (400 hall)                11:30am-12:00pm (500 hall)  Type of Therapy and Topic:  Group Therapy: Anger Cues and Responses  Participation Level:  Active   Description of Group:   In this group, patients learned how to recognize the physical, cognitive, emotional, and behavioral responses they have to anger-provoking situations.  They identified a recent time they became angry and how they reacted.  They analyzed how their reaction was possibly beneficial and how it was possibly unhelpful.  The group discussed a variety of healthier coping skills that could help with such a situation in the future.  Deep breathing was practiced briefly.  Therapeutic Goals: 1. Patients will remember their last incident of anger and how they felt emotionally and physically, what their thoughts were at the time, and how they behaved. 2. Patients will identify how their behavior at that time worked for them, as well as how it worked against them. 3. Patients will explore possible new behaviors to use in future anger situations. 4. Patients will learn that anger itself is normal and cannot be eliminated, and that healthier reactions can assist with resolving conflict rather than worsening situations.  Summary of Patient Progress:  The patient shared that his most recent time of anger was last Saturday when somebody lied to him and said his body shuts down and he does not yell at people but they can still tell that he is upset.  He showed some insight as the group discussion progressed.  Therapeutic Modalities:   Cognitive Behavioral Therapy  Todd [name]  09/09/2017 12:00pm

## 2017-09-09 NOTE — Discharge Instructions (Signed)

## 2017-09-09 NOTE — [facility] Suicide Risk Assessment (Signed)
[facility]   Principal Problem: MDD (major depressive disorder), severe [facility](HCC) Discharge Diagnoses:  Patient Active Problem List   Diagnosis Date Noted  . MDD (major depressive disorder), severe (HCC) [F32.2] 09/06/2017    Total Time spent with patient: 45 minutes  Musculoskeletal: Strength & Muscle Tone: within normal limits Gait & Station: normal Patient leans: N/A  Psychiatric Specialty Exam: Review of Systems  Constitutional: Negative.   HENT: Negative.   Eyes: Negative.   Respiratory: Negative.   Cardiovascular: Negative.   Gastrointestinal: Negative.   Genitourinary: Negative.   Musculoskeletal: Negative.   Skin: Negative.   Neurological: Negative.   Endo/Heme/Allergies: Negative.   Psychiatric/Behavioral: Negative for depression, hallucinations, memory loss, substance abuse and suicidal ideas. The patient is not nervous/anxious and does not have insomnia.     Blood pressure 114/64, pulse (!) 52, temperature 98.3 F (36.8 C), temperature source Oral, resp. rate 16, height 6' 0.25" (1.835 m), weight 76.3 kg (168 lb 4 oz), SpO2 100 %.Body mass index is 22.66 kg/m.  General Appearance: Neatly dressed, pleasant, engaging well and cooperative. Appropriate behavior. Not in any distress. Good relatedness. Not internally stimulated.  Eye Contact::  Good  Speech:  Spontaneous, normal prosody. Normal tone and rate.   Volume:  Normal  Mood:  Euthymic  Affect:  Appropriate and Full Range  Thought Process:  Linear  Orientation:  Full (Time, Place, and Person)  Thought Content:  Future oriented. No delusional theme. No preoccupation with violent thoughts. No negative ruminations. No obsession.  No hallucination in any modality.   Suicidal Thoughts:  No  Homicidal Thoughts:  No  Memory:  Immediate;   Good Recent;   Good Remote;   Good  Judgement:  Good  Insight:  Good  Psychomotor Activity:  Normal  Concentration:  Good  Recall:  Good  Fund of  Knowledge:Good  Language: Good  Akathisia:  Negative  Handed:    AIMS (if indicated):     Assets:  Communication Skills Desire for Improvement Financial Resources/Insurance Housing Leisure Time Physical Health Resilience Social Support Talents/Skills Transportation Vocational/Educational  Sleep:  Number of Hours: 5.75  Cognition: WNL  ADL's:  Intact   Clinical Assessment::   27 y.o AAM, single, self employed, lives with his family. No past history of mental illness. Presented to the ER in company of his mother. Reports feeling depressed. Reported to have expresed thoughts of slitting his wrist. Main stressor is recent end of a long term relationship. Routine labs essentially normal except for mild hypokalemia. Toxicology is negative. UDS is negative. BAL<10 mg/dl.  Seen today. Says he is no longer welling on his past relationship. Reports that he is in good spirits. Not feeling depressed. Reports normal energy and interest. Has been maintaining normal biological functions. He is able to think clearly. He is able to focus on task. His thoughts are not crowded or racing. No evidence of mania. No hallucination in any modality. He is not making any delusional statement. No passivity of will/thought. He is fully in touch with reality. No thoughts of suicide. No thoughts of homicide. No violent thoughts. No overwhelming anxiety. No access to weapons.  Denies any new stressors at home. No financial constraints. No legal issues. No substance use    Nursing staff reports that patient has been appropriate on the unit. Patient has been interacting well with peers. No behavioral issues. Patient has not voiced any suicidal thoughts. Patient has not been observed to be internally stimulated. Patient has been  adherent with treatment recommendations. Patient has been tolerating their medication well.   Patient was discussed at team. Team members feels that patient is back to his baseline level of  function. Team agrees with plan to discharge patient today.   Demographic Factors:  NA  Loss Factors: Loss of significant relationship  Historical Factors: NA  Risk Reduction Factors:   Sense of responsibility to family, Religious beliefs about death, Employed, Living with another person, especially a relative, Positive social support, Positive therapeutic relationship and Positive coping skills or problem solving skills  Continued Clinical Symptoms:  As above   Cognitive Features That Contribute To Risk:  None    Suicide Risk:  Minimal: No identifiable suicidal ideation. Patient is not having any thoughts of suicide at this time. Modifiable risk factors targeted during this admission includes adjustment disorder. Demographical and historical risk factors cannot be modified. Patient is now engaging well. Patient is reliable and is future oriented. We have buffered patient's support structures. At this point, patient is at low risk of suicide. Patient is aware of the effects of psychoactive substances on decision making process. Patient has been provided with emergency contacts. Patient acknowledges to use resources provided if unforseen circumstances changes their current risk stratification.    Follow-up Information    [facility] Follow up.   Why:  Your social worker, Tammy Sours, will contact you on Monday with an appointment time to begin therapy. Contact information: [address] [place] [address] [phone]           Plan Of Care/Follow-up recommendations:  1. Continue current psychotropic medications 2. Mental health and addiction follow up as arranged.  3. Discharge in care of his family 4. Provided limited quantity of prescriptions   Georgiann Cocker, MD 09/09/2017, 10:35 AM

## 2017-09-12 NOTE — Progress Notes (Signed)
[facility] with therapist Harlin RainDavid Pate on Tuesday, 09/19/17 at 2:00pm.  CSW called pt and also spoke to his mother and informed them of this appt, which does work for them. Garner NashGregory Chantille Navarrete, MSW, LCSW Clinical Social Worker 09/12/2017 10:16 AM
